# Patient Record
Sex: Male | Born: 1965 | Race: White | Hispanic: No | Marital: Married | Smoking: Former smoker
Health system: Southern US, Community
[De-identification: ages and names within clinical notes are randomized; demographics above are authoritative.]

## PROBLEM LIST (undated history)

## (undated) DIAGNOSIS — F419 Anxiety disorder, unspecified: Secondary | ICD-10-CM

## (undated) DIAGNOSIS — K219 Gastro-esophageal reflux disease without esophagitis: Secondary | ICD-10-CM

## (undated) DIAGNOSIS — I10 Essential (primary) hypertension: Secondary | ICD-10-CM

## (undated) DIAGNOSIS — R51 Headache: Secondary | ICD-10-CM

## (undated) DIAGNOSIS — M1990 Unspecified osteoarthritis, unspecified site: Secondary | ICD-10-CM

## (undated) DIAGNOSIS — R519 Headache, unspecified: Secondary | ICD-10-CM

## (undated) DIAGNOSIS — J45909 Unspecified asthma, uncomplicated: Secondary | ICD-10-CM

## (undated) HISTORY — PX: DG THUMB LEFT HAND: HXRAD1658

## (undated) HISTORY — PX: TONSILLECTOMY: SUR1361

---

## 1998-08-01 ENCOUNTER — Ambulatory Visit (HOSPITAL_BASED_OUTPATIENT_CLINIC_OR_DEPARTMENT_OTHER): Admission: RE | Admit: 1998-08-01 | Discharge: 1998-08-01 | Payer: Self-pay | Admitting: Orthopedic Surgery

## 2000-08-06 ENCOUNTER — Encounter: Payer: Self-pay | Admitting: Family Medicine

## 2000-08-06 ENCOUNTER — Encounter: Admission: RE | Admit: 2000-08-06 | Discharge: 2000-08-06 | Payer: Self-pay | Admitting: Family Medicine

## 2001-11-20 ENCOUNTER — Encounter (INDEPENDENT_AMBULATORY_CARE_PROVIDER_SITE_OTHER): Payer: Self-pay | Admitting: *Deleted

## 2001-11-20 ENCOUNTER — Ambulatory Visit (HOSPITAL_BASED_OUTPATIENT_CLINIC_OR_DEPARTMENT_OTHER): Admission: RE | Admit: 2001-11-20 | Discharge: 2001-11-20 | Payer: Self-pay | Admitting: General Surgery

## 2004-10-24 ENCOUNTER — Encounter: Admission: RE | Admit: 2004-10-24 | Discharge: 2004-10-24 | Payer: Self-pay | Admitting: Orthopedic Surgery

## 2008-09-24 ENCOUNTER — Emergency Department (HOSPITAL_COMMUNITY): Admission: EM | Admit: 2008-09-24 | Discharge: 2008-09-24 | Payer: Self-pay | Admitting: Emergency Medicine

## 2010-03-11 ENCOUNTER — Emergency Department (HOSPITAL_COMMUNITY): Admission: EM | Admit: 2010-03-11 | Discharge: 2010-03-11 | Payer: Self-pay | Admitting: Emergency Medicine

## 2011-01-08 LAB — ETHANOL: Alcohol, Ethyl (B): <5 mg/dL (ref 0–10)

## 2011-01-08 LAB — RAPID URINE DRUG SCREEN, HOSP PERFORMED
Amphetamines: NOT DETECTED
Barbiturates: NOT DETECTED
Benzodiazepines: POSITIVE — AB
Cocaine: NOT DETECTED
Opiates: NOT DETECTED
Tetrahydrocannabinol: POSITIVE — AB

## 2011-01-08 LAB — DIFFERENTIAL
Eosinophils Absolute: 0 10*3/uL (ref 0.0–0.7)
Lymphocytes Relative: 11 % — ABNORMAL LOW (ref 12–46)
Lymphs Abs: 1.2 10*3/uL (ref 0.7–4.0)
Neutro Abs: 9.2 10*3/uL — ABNORMAL HIGH (ref 1.7–7.7)
Neutrophils Relative %: 81 % — ABNORMAL HIGH (ref 43–77)

## 2011-01-08 LAB — CBC
MCV: 91.5 fL (ref 78.0–100.0)
Platelets: 175 10*3/uL (ref 150–400)
RBC: 4.44 MIL/uL (ref 4.22–5.81)
WBC: 11.3 10*3/uL — ABNORMAL HIGH (ref 4.0–10.5)

## 2011-01-08 LAB — BASIC METABOLIC PANEL
BUN: 12 mg/dL (ref 6–23)
Calcium: 8.8 mg/dL (ref 8.4–10.5)
Creatinine, Ser: 0.66 mg/dL (ref 0.4–1.5)
GFR calc Af Amer: >60 mL/min (ref >60)
GFR calc non Af Amer: >60 mL/min (ref >60)

## 2011-01-08 LAB — URINALYSIS, ROUTINE W REFLEX MICROSCOPIC
Bilirubin Urine: NEGATIVE
Glucose, UA: NEGATIVE mg/dL
Hgb urine dipstick: NEGATIVE
Ketones, ur: NEGATIVE mg/dL
Nitrite: NEGATIVE
Protein, ur: NEGATIVE mg/dL
Specific Gravity, Urine: 1.011 (ref 1.005–1.030)
Urobilinogen, UA: 0.2 mg/dL (ref 0.0–1.0)
pH: 6 (ref 5.0–8.0)

## 2011-01-08 LAB — GLUCOSE, CAPILLARY: Glucose-Capillary: 89 mg/dL (ref 70–99)

## 2011-03-09 NOTE — Op Note (Signed)
[place]  Patient:    Jason Mcknight, Jason Mcknight Visit Number: [id number] MRN: [id number]          Service Type: DSU Location: [facility] Attending Physician:  Caleen Essex Dictated by:   Chevis Pretty, M.D. Proc. Date: 11/20/01 Admit Date:  11/20/2001                             Operative Report  PREOPERATIVE DIAGNOSIS:  Pilonidal cyst.  POSTOPERATIVE DIAGNOSIS:  Pilonidal cyst.  OPERATION PERFORMED:  Incision and drainage of pilonidal cyst.  SURGEON:  Chevis Pretty, M.D.  ANESTHESIA:  General endotracheal.  DESCRIPTION OF PROCEDURE:  After informed consent was obtained, the patient was brought to the operating room and left in supine position on the hospital bed.  After adequate induction of general anesthesia, the patient was flipped into a prone position on the operating room table and all pressure points were padded.  Once this was done, the patients gluteal area was prepped with Betadine and draped in the usual sterile manner.  The cystic area in question was readily identifiable and there were a couple of sinus tracts to the skin of the gluteal cleft.  An elliptical incision was made overtop the firm cystic area and the pilonidal cavity was found.  The sinus tracts were probed with a small silver probe and care was taken to make sure that the sinus tracts were opened sharply with the Bovie electrocautery.  The cavity was probed and the depths of it were completely opened also with the Bovie electrocautery until the entire cavity was opened and free.  No more sinus tracts could be identified.  Hemostasis was achieved using the Bovie electrocautery to the side walls of the wound.  At this point the wound was cleaned out and packed with gauze.  Sterile dressings were applied.  The patient tolerated the procedure well.  The patient was then placed back on the hospital bed in the supine position and awakened without difficulty.  The patient was then taken to the  recovery room in stable condition.  At the end of the case all sponge, needle and instrument counts were correct. Dictated by:   Chevis Pretty, M.D. Attending Physician:  Caleen Essex DD:  11/20/01 TD:  11/21/01 Job: [id number]

## 2017-07-31 LAB — HEPATIC FUNCTION PANEL
ALT: 32 (ref 10–40)
AST: 22 (ref 14–40)
Bilirubin, Total: 0.6

## 2017-07-31 LAB — BASIC METABOLIC PANEL
BUN: 16 (ref 4–21)
CREATININE: 0.7 (ref 0.6–1.3)
Glucose: 89
Potassium: 3.9 (ref 3.4–5.3)
Sodium: 138 (ref 137–147)

## 2017-07-31 LAB — CBC AND DIFFERENTIAL
HEMATOCRIT: 45 (ref 41–53)
HEMOGLOBIN: 15.2 (ref 13.5–17.5)
WBC: 7.6

## 2017-07-31 LAB — LIPID PANEL
CHOLESTEROL: 189 (ref 0–200)
HDL: 138 — AB (ref 35–70)
LDL CALC: 103
Triglycerides: 173 — AB (ref 40–160)

## 2017-07-31 LAB — HEMOGLOBIN A1C: HEMOGLOBIN A1C: 5.3

## 2017-12-20 ENCOUNTER — Other Ambulatory Visit: Payer: Self-pay | Admitting: Orthopedic Surgery

## 2017-12-31 ENCOUNTER — Other Ambulatory Visit: Payer: Self-pay | Admitting: Orthopedic Surgery

## 2017-12-31 ENCOUNTER — Ambulatory Visit (HOSPITAL_COMMUNITY)
Admission: RE | Admit: 2017-12-31 | Discharge: 2017-12-31 | Disposition: A | Discharge status: Home / Self Care | Payer: 59 | Source: Ambulatory Visit | Attending: Orthopedic Surgery | Admitting: Orthopedic Surgery

## 2017-12-31 ENCOUNTER — Other Ambulatory Visit: Payer: Self-pay

## 2017-12-31 ENCOUNTER — Encounter (HOSPITAL_COMMUNITY): Payer: Self-pay

## 2017-12-31 ENCOUNTER — Encounter (HOSPITAL_COMMUNITY)
Admission: RE | Admit: 2017-12-31 | Discharge: 2017-12-31 | Disposition: A | Discharge status: Home / Self Care | Payer: 59 | Source: Ambulatory Visit | Attending: Orthopedic Surgery | Admitting: Orthopedic Surgery

## 2017-12-31 DIAGNOSIS — Z01818 Encounter for other preprocedural examination: Secondary | ICD-10-CM

## 2017-12-31 DIAGNOSIS — Z01810 Encounter for preprocedural cardiovascular examination: Secondary | ICD-10-CM | POA: Diagnosis present

## 2017-12-31 DIAGNOSIS — Z01812 Encounter for preprocedural laboratory examination: Secondary | ICD-10-CM | POA: Diagnosis present

## 2017-12-31 DIAGNOSIS — M1712 Unilateral primary osteoarthritis, left knee: Secondary | ICD-10-CM | POA: Diagnosis not present

## 2017-12-31 HISTORY — DX: Unspecified asthma, uncomplicated: J45.909

## 2017-12-31 HISTORY — DX: Headache: R51

## 2017-12-31 HISTORY — DX: Gastro-esophageal reflux disease without esophagitis: K21.9

## 2017-12-31 HISTORY — DX: Unspecified osteoarthritis, unspecified site: M19.90

## 2017-12-31 HISTORY — DX: Anxiety disorder, unspecified: F41.9

## 2017-12-31 HISTORY — DX: Essential (primary) hypertension: I10

## 2017-12-31 HISTORY — DX: Headache, unspecified: R51.9

## 2017-12-31 LAB — PROTIME-INR
INR: 1.03
Prothrombin Time: 13.4 seconds (ref 11.4–15.2)

## 2017-12-31 LAB — SURGICAL PCR SCREEN
MRSA, PCR: NEGATIVE
Staphylococcus aureus: NEGATIVE

## 2017-12-31 LAB — CBC WITH DIFFERENTIAL/PLATELET
Basophils Absolute: 0 10*3/uL (ref 0.0–0.1)
Basophils Relative: 0 %
EOS ABS: 0.1 10*3/uL (ref 0.0–0.7)
Eosinophils Relative: 1 %
HCT: 42.3 % (ref 39.0–52.0)
Hemoglobin: 14.8 g/dL (ref 13.0–17.0)
LYMPHS ABS: 1.9 10*3/uL (ref 0.7–4.0)
LYMPHS PCT: 26 %
MCH: 32.2 pg (ref 26.0–34.0)
MCHC: 35 g/dL (ref 30.0–36.0)
MCV: 92 fL (ref 78.0–100.0)
Monocytes Absolute: 0.8 10*3/uL (ref 0.1–1.0)
Monocytes Relative: 10 %
Neutro Abs: 4.7 10*3/uL (ref 1.7–7.7)
Neutrophils Relative %: 63 %
Platelets: 214 10*3/uL (ref 150–400)
RBC: 4.6 MIL/uL (ref 4.22–5.81)
RDW: 12.6 % (ref 11.5–15.5)
WBC: 7.4 10*3/uL (ref 4.0–10.5)

## 2017-12-31 LAB — COMPREHENSIVE METABOLIC PANEL
ALK PHOS: 65 U/L (ref 38–126)
ALT: 35 U/L (ref 17–63)
AST: 29 U/L (ref 15–41)
Albumin: 4.4 g/dL (ref 3.5–5.0)
Anion gap: 8 (ref 5–15)
BUN: 14 mg/dL (ref 6–20)
CALCIUM: 9.4 mg/dL (ref 8.9–10.3)
CO2: 28 mmol/L (ref 22–32)
CREATININE: 0.7 mg/dL (ref 0.61–1.24)
Chloride: 103 mmol/L (ref 101–111)
GFR calc non Af Amer: >60 mL/min (ref >60)
Glucose, Bld: 97 mg/dL (ref 65–99)
Potassium: 4.2 mmol/L (ref 3.5–5.1)
SODIUM: 139 mmol/L (ref 135–145)
Total Bilirubin: 0.4 mg/dL (ref 0.3–1.2)
Total Protein: 7.4 g/dL (ref 6.5–8.1)

## 2017-12-31 LAB — ABO/RH: ABO/RH(D): B POS

## 2017-12-31 LAB — URINALYSIS, ROUTINE W REFLEX MICROSCOPIC
BILIRUBIN URINE: NEGATIVE
GLUCOSE, UA: NEGATIVE mg/dL
HGB URINE DIPSTICK: NEGATIVE
KETONES UR: NEGATIVE mg/dL
LEUKOCYTES UA: NEGATIVE
NITRITE: NEGATIVE
Protein, ur: NEGATIVE mg/dL
Specific Gravity, Urine: 1.026 (ref 1.005–1.030)
pH: 5 (ref 5.0–8.0)

## 2017-12-31 LAB — APTT: aPTT: 29 seconds (ref 24–36)

## 2017-12-31 NOTE — Patient Instructions (Signed)
Jason Mcknight  12/31/2017   Your procedure is scheduled on: Friday 01/03/2018   Report to [facility] Main  Entrance              Report to admitting at   1245 PM    Call this number if you have problems the morning of surgery [phone]    Remember: Do not eat food :After Midnight.  May have clear liquids from midnight up until 0915 am then nothing until after surgery!     CLEAR LIQUID DIET   Foods Allowed                                                                     Foods Excluded  Coffee and tea, regular and decaf                             liquids that you cannot  Plain Jell-O in any flavor                                             see through such as: Fruit ices (not with fruit pulp)                                     milk, soups, orange juice  Iced Popsicles                                    All solid food Carbonated beverages, regular and diet                                    Cranberry, grape and apple juices Sports drinks like Gatorade Lightly seasoned clear broth or consume(fat free) Sugar, honey syrup  Sample Menu Breakfast                                Lunch                                     Supper Cranberry juice                    Beef broth                            Chicken broth Jell-O                                     Grape juice  Apple juice Coffee or tea                        Jell-O                                      Popsicle                                                Coffee or tea                        Coffee or tea  _____________________________________________________________________     Take these medicines the morning of surgery with A SIP OF WATER: Atenolol (Tenormin)                                You may not have any metal on your body including hair pins and              piercings  Do not wear jewelry, make-up, lotions, powders or perfumes, deodorant             Do  not wear nail polish.  Do not shave  48 hours prior to surgery.              Men may shave face and neck.   Do not bring valuables to the hospital. [place].  Contacts, dentures or bridgework may not be worn into surgery.  Leave suitcase in the car. After surgery it may be brought to your room.                  Please read over the following fact sheets you were given: _____________________________________________________________________             [facility] - Preparing for Surgery Before surgery, you can play an important role.  Because skin is not sterile, your skin needs to be as free of germs as possible.  You can reduce the number of germs on your skin by washing with CHG (chlorahexidine gluconate) soap before surgery.  CHG is an antiseptic cleaner which kills germs and bonds with the skin to continue killing germs even after washing. Please DO NOT use if you have an allergy to CHG or antibacterial soaps.  If your skin becomes reddened/irritated stop using the CHG and inform your nurse when you arrive at Short Stay. Do not shave (including legs and underarms) for at least 48 hours prior to the first CHG shower.  You may shave your face/neck. Please follow these instructions carefully:  1.  Shower with CHG Soap the night before surgery and the  morning of Surgery.  2.  If you choose to wash your hair, wash your hair first as usual with your  normal  shampoo.  3.  After you shampoo, rinse your hair and body thoroughly to remove the  shampoo.                           4.  Use CHG as you would any  other liquid soap.  You can apply chg directly  to the skin and wash                       Gently with a scrungie or clean washcloth.  5.  Apply the CHG Soap to your body ONLY FROM THE NECK DOWN.   Do not use on face/ open                           Wound or open sores. Avoid contact with eyes, ears mouth and genitals (private parts).                        Wash face,  Genitals (private parts) with your normal soap.             6.  Wash thoroughly, paying special attention to the area where your surgery  will be performed.  7.  Thoroughly rinse your body with warm water from the neck down.  8.  DO NOT shower/wash with your normal soap after using and rinsing off  the CHG Soap.                9.  Pat yourself dry with a clean towel.            10.  Wear clean pajamas.            11.  Place clean sheets on your bed the night of your first shower and do not  sleep with pets. Day of Surgery : Do not apply any lotions/deodorants the morning of surgery.  Please wear clean clothes to the hospital/surgery center.  FAILURE TO FOLLOW THESE INSTRUCTIONS MAY RESULT IN THE CANCELLATION OF YOUR SURGERY PATIENT SIGNATURE_________________________________  NURSE SIGNATURE__________________________________  ________________________________________________________________________   Adam Phenix  An incentive spirometer is a tool that can help keep your lungs clear and active. This tool measures how well you are filling your lungs with each breath. Taking long deep breaths may help reverse or decrease the chance of developing breathing (pulmonary) problems (especially infection) following:  A long period of time when you are unable to move or be active. BEFORE THE PROCEDURE   If the spirometer includes an indicator to show your best effort, your nurse or respiratory therapist will set it to a desired goal.  If possible, sit up straight or lean slightly forward. Try not to slouch.  Hold the incentive spirometer in an upright position. INSTRUCTIONS FOR USE  1. Sit on the edge of your bed if possible, or sit up as far as you can in bed or on a chair. 2. Hold the incentive spirometer in an upright position. 3. Breathe out normally. 4. Place the mouthpiece in your mouth and seal your lips tightly around it. 5. Breathe in slowly and as  deeply as possible, raising the piston or the ball toward the top of the column. 6. Hold your breath for 3-5 seconds or for as long as possible. Allow the piston or ball to fall to the bottom of the column. 7. Remove the mouthpiece from your mouth and breathe out normally. 8. Rest for a few seconds and repeat Steps 1 through 7 at least 10 times every 1-2 hours when you are awake. Take your time and take a few normal breaths between deep breaths. 9. The spirometer may include an indicator to show your best effort. Use the indicator as  a goal to work toward during each repetition. 10. After each set of 10 deep breaths, practice coughing to be sure your lungs are clear. If you have an incision (the cut made at the time of surgery), support your incision when coughing by placing a pillow or rolled up towels firmly against it. Once you are able to get out of bed, walk around indoors and cough well. You may stop using the incentive spirometer when instructed by your caregiver.  RISKS AND COMPLICATIONS  Take your time so you do not get dizzy or light-headed.  If you are in pain, you may need to take or ask for pain medication before doing incentive spirometry. It is harder to take a deep breath if you are having pain. AFTER USE  Rest and breathe slowly and easily.  It can be helpful to keep track of a log of your progress. Your caregiver can provide you with a simple table to help with this. If you are using the spirometer at home, follow these instructions: [place] IF:   You are having difficultly using the spirometer.  You have trouble using the spirometer as often as instructed.  Your pain medication is not giving enough relief while using the spirometer.  You develop fever of 100.5 F (38.1 C) or higher. SEEK IMMEDIATE MEDICAL CARE IF:   You cough up bloody sputum that had not been present before.  You develop fever of 102 F (38.9 C) or greater.  You develop worsening pain  at or near the incision site. MAKE SURE YOU:   Understand these instructions.  Will watch your condition.  Will get help right away if you are not doing well or get worse. Document Released: 02/18/2007 Document Revised: 12/31/2011 Document Reviewed: 04/21/2007 ExitCare Patient Information 2014 Exit[employer], [place].   ________________________________________________________________________  WHAT IS A BLOOD TRANSFUSION? Blood Transfusion Information  A transfusion is the replacement of blood or some of its parts. Blood is made up of multiple cells which provide different functions.  Red blood cells carry oxygen and are used for blood loss replacement.  White blood cells fight against infection.  Platelets control bleeding.  Plasma helps clot blood.  Other blood products are available for specialized needs, such as hemophilia or other clotting disorders. BEFORE THE TRANSFUSION  Who gives blood for transfusions?   Healthy volunteers who are fully evaluated to make sure their blood is safe. This is blood bank blood. Transfusion therapy is the safest it has ever been in the practice of medicine. Before blood is taken from a donor, a complete history is taken to make sure that person has no history of diseases nor engages in risky social behavior (examples are intravenous drug use or sexual activity with multiple partners). The donor's travel history is screened to minimize risk of transmitting infections, such as malaria. The donated blood is tested for signs of infectious diseases, such as HIV and hepatitis. The blood is then tested to be sure it is compatible with you in order to minimize the chance of a transfusion reaction. If you or a relative donates blood, this is often done in anticipation of surgery and is not appropriate for emergency situations. It takes many days to process the donated blood. RISKS AND COMPLICATIONS Although transfusion therapy is very safe and saves many lives, the  main dangers of transfusion include:   Getting an infectious disease.  Developing a transfusion reaction. This is an allergic reaction to something in the blood you were given.  Every precaution is taken to prevent this. The decision to have a blood transfusion has been considered carefully by your caregiver before blood is given. Blood is not given unless the benefits outweigh the risks. AFTER THE TRANSFUSION  Right after receiving a blood transfusion, you will usually feel much better and more energetic. This is especially true if your red blood cells have gotten low (anemic). The transfusion raises the level of the red blood cells which carry oxygen, and this usually causes an energy increase.  The nurse administering the transfusion will monitor you carefully for complications. HOME CARE INSTRUCTIONS  No special instructions are needed after a transfusion. You may find your energy is better. Speak with your caregiver about any limitations on activity for underlying diseases you may have. SEEK MEDICAL CARE IF:   Your condition is not improving after your transfusion.  You develop redness or irritation at the intravenous (IV) site. SEEK IMMEDIATE MEDICAL CARE IF:  Any of the following symptoms occur over the next 12 hours:  Shaking chills.  You have a temperature by mouth above 102 F (38.9 C), not controlled by medicine.  Chest, back, or muscle pain.  People around you feel you are not acting correctly or are confused.  Shortness of breath or difficulty breathing.  Dizziness and fainting.  You get a rash or develop hives.  You have a decrease in urine output.  Your urine turns a dark color or changes to pink, red, or brown. Any of the following symptoms occur over the next 10 days:  You have a temperature by mouth above 102 F (38.9 C), not controlled by medicine.  Shortness of breath.  Weakness after normal activity.  The white part of the eye turns yellow  (jaundice).  You have a decrease in the amount of urine or are urinating less often.  Your urine turns a dark color or changes to pink, red, or brown. Document Released: 10/05/2000 Document Revised: 12/31/2011 Document Reviewed: 05/24/2008 [facility] Patient Information 2014 [place], [place].  _______________________________________________________________________

## 2018-01-02 NOTE — Anesthesia Preprocedure Evaluation (Addendum)
Anesthesia Evaluation  Patient identified by MRN, date of birth, ID band Patient awake    Reviewed: Allergy & Precautions, NPO status , Patient's Chart, lab work & pertinent test results  Airway Mallampati: II  TM Distance: >3 FB Neck ROM: Full    Dental no notable dental hx.    Pulmonary asthma , former smoker,    Pulmonary exam normal breath sounds clear to auscultation       Cardiovascular hypertension, Normal cardiovascular exam Rhythm:Regular Rate:Normal     Neuro/Psych negative neurological ROS     GI/Hepatic Neg liver ROS,   Endo/Other    Renal/GU      Musculoskeletal   Abdominal   Peds  Hematology   Anesthesia Other Findings   Reproductive/Obstetrics                            Lab Results  Component Value Date   WBC 7.4 12/31/2017   HGB 14.8 12/31/2017   HCT 42.3 12/31/2017   MCV 92.0 12/31/2017   PLT 214 12/31/2017   Lab Results  Component Value Date   CREATININE 0.70 12/31/2017   BUN 14 12/31/2017   NA 139 12/31/2017   K 4.2 12/31/2017   CL 103 12/31/2017   CO2 28 12/31/2017     Anesthesia Physical Anesthesia Plan  ASA: II  Anesthesia Plan: Spinal   Post-op Pain Management:    Induction:   PONV Risk Score and Plan:   Airway Management Planned: Mask, Natural Airway and Nasal Cannula  Additional Equipment:   Intra-op Plan:   Post-operative Plan:   Informed Consent: I have reviewed the patients History and Physical, chart, labs and discussed the procedure including the risks, benefits and alternatives for the proposed anesthesia with the patient or authorized representative who has indicated his/her understanding and acceptance.     Plan Discussed with: CRNA  Anesthesia Plan Comments:         Anesthesia Quick Evaluation

## 2018-01-03 ENCOUNTER — Inpatient Hospital Stay (HOSPITAL_COMMUNITY): Payer: 59 | Admitting: Anesthesiology

## 2018-01-03 ENCOUNTER — Encounter (HOSPITAL_COMMUNITY): Payer: Self-pay

## 2018-01-03 ENCOUNTER — Other Ambulatory Visit: Payer: Self-pay

## 2018-01-03 ENCOUNTER — Inpatient Hospital Stay (HOSPITAL_COMMUNITY)
Admission: RE | Admit: 2018-01-03 | Discharge: 2018-01-04 | DRG: 470 | Disposition: A | Discharge status: Home / Self Care | Payer: 59 | Source: Ambulatory Visit | Attending: Orthopedic Surgery | Admitting: Orthopedic Surgery

## 2018-01-03 ENCOUNTER — Encounter (HOSPITAL_COMMUNITY)
Admission: RE | Disposition: A | Discharge status: Home / Self Care | Payer: Self-pay | Source: Ambulatory Visit | Attending: Orthopedic Surgery

## 2018-01-03 DIAGNOSIS — M1712 Unilateral primary osteoarthritis, left knee: Secondary | ICD-10-CM | POA: Diagnosis present

## 2018-01-03 DIAGNOSIS — I10 Essential (primary) hypertension: Secondary | ICD-10-CM | POA: Diagnosis present

## 2018-01-03 DIAGNOSIS — Z87891 Personal history of nicotine dependence: Secondary | ICD-10-CM | POA: Diagnosis not present

## 2018-01-03 DIAGNOSIS — F419 Anxiety disorder, unspecified: Secondary | ICD-10-CM | POA: Diagnosis present

## 2018-01-03 DIAGNOSIS — M223X2 Other derangements of patella, left knee: Secondary | ICD-10-CM | POA: Diagnosis present

## 2018-01-03 DIAGNOSIS — D62 Acute posthemorrhagic anemia: Secondary | ICD-10-CM | POA: Diagnosis not present

## 2018-01-03 DIAGNOSIS — M25562 Pain in left knee: Secondary | ICD-10-CM | POA: Diagnosis present

## 2018-01-03 HISTORY — PX: TOTAL KNEE ARTHROPLASTY: SHX125

## 2018-01-03 LAB — TYPE AND SCREEN
ABO/RH(D): B POS
Antibody Screen: NEGATIVE

## 2018-01-03 SURGERY — ARTHROPLASTY, KNEE, TOTAL
Anesthesia: Spinal | Site: Knee | Laterality: Left

## 2018-01-03 MED ORDER — ALUM & MAG HYDROXIDE-SIMETH 200-200-20 MG/5ML PO SUSP: 30.0000 mL | ORAL | Status: DC | PRN

## 2018-01-03 MED ORDER — SODIUM CHLORIDE 0.9 % IJ SOLN
INTRAMUSCULAR | Status: DC | PRN
  Administered 2018-01-03: 30 mL

## 2018-01-03 MED ORDER — POLYETHYLENE GLYCOL 3350 17 G PO PACK: 17.0000 g | PACK | Freq: Every day | ORAL | Status: DC | PRN

## 2018-01-03 MED ORDER — CEFAZOLIN SODIUM-DEXTROSE 2-4 GM/100ML-% IV SOLN
2.0000 g | INTRAVENOUS | Status: AC
  Administered 2018-01-03: 2 g via INTRAVENOUS
  Filled 2018-01-03: qty 100

## 2018-01-03 MED ORDER — CHLORHEXIDINE GLUCONATE 4 % EX LIQD: 60.0000 mL | Freq: Once | CUTANEOUS | Status: DC

## 2018-01-03 MED ORDER — MIDAZOLAM HCL 5 MG/5ML IJ SOLN
INTRAMUSCULAR | Status: DC | PRN
  Administered 2018-01-03: 2 mg via INTRAVENOUS

## 2018-01-03 MED ORDER — GABAPENTIN 300 MG PO CAPS
300.0000 mg | ORAL_CAPSULE | Freq: Two times a day (BID) | ORAL | Status: DC
  Administered 2018-01-03 – 2018-01-04 (×2): 300 mg via ORAL
  Filled 2018-01-03 (×2): qty 1

## 2018-01-03 MED ORDER — TRANEXAMIC ACID 1000 MG/10ML IV SOLN
2000.0000 mg | Freq: Once | INTRAVENOUS | Status: DC
  Filled 2018-01-03: qty 20

## 2018-01-03 MED ORDER — 0.9 % SODIUM CHLORIDE (POUR BTL) OPTIME
TOPICAL | Status: DC | PRN
  Administered 2018-01-03: 1000 mL

## 2018-01-03 MED ORDER — TRANEXAMIC ACID 1000 MG/10ML IV SOLN
1000.0000 mg | INTRAVENOUS | Status: AC
  Administered 2018-01-03: 1000 mg via INTRAVENOUS
  Filled 2018-01-03: qty 1100

## 2018-01-03 MED ORDER — ONDANSETRON HCL 4 MG/2ML IJ SOLN
4.0000 mg | Freq: Four times a day (QID) | INTRAMUSCULAR | Status: DC | PRN
  Filled 2018-01-03: qty 2

## 2018-01-03 MED ORDER — PROPOFOL 500 MG/50ML IV EMUL
INTRAVENOUS | Status: DC | PRN
  Administered 2018-01-03: 75 ug/kg/min via INTRAVENOUS

## 2018-01-03 MED ORDER — CEFAZOLIN SODIUM-DEXTROSE 2-4 GM/100ML-% IV SOLN
2.0000 g | Freq: Four times a day (QID) | INTRAVENOUS | Status: AC
  Administered 2018-01-03 – 2018-01-04 (×2): 2 g via INTRAVENOUS
  Filled 2018-01-03 (×2): qty 100

## 2018-01-03 MED ORDER — SODIUM CHLORIDE 0.9 % IJ SOLN
INTRAMUSCULAR | Status: AC
  Filled 2018-01-03: qty 50

## 2018-01-03 MED ORDER — FENTANYL CITRATE (PF) 100 MCG/2ML IJ SOLN: 50.0000 ug | INTRAMUSCULAR | Status: DC

## 2018-01-03 MED ORDER — PROPOFOL 10 MG/ML IV BOLUS
INTRAVENOUS | Status: AC
  Filled 2018-01-03: qty 20

## 2018-01-03 MED ORDER — PRAVASTATIN SODIUM 20 MG PO TABS
10.0000 mg | ORAL_TABLET | Freq: Every day | ORAL | Status: DC
  Filled 2018-01-03: qty 1

## 2018-01-03 MED ORDER — BUPIVACAINE LIPOSOME 1.3 % IJ SUSP
20.0000 mL | Freq: Once | INTRAMUSCULAR | Status: DC
  Filled 2018-01-03: qty 20

## 2018-01-03 MED ORDER — FENTANYL CITRATE (PF) 100 MCG/2ML IJ SOLN
INTRAMUSCULAR | Status: AC
  Administered 2018-01-03: 100 ug
  Filled 2018-01-03: qty 2

## 2018-01-03 MED ORDER — OXYCODONE HCL 5 MG PO TABS
5.0000 mg | ORAL_TABLET | ORAL | Status: DC | PRN
  Administered 2018-01-03 – 2018-01-04 (×3): 10 mg via ORAL
  Filled 2018-01-03 (×3): qty 2

## 2018-01-03 MED ORDER — BUPIVACAINE-EPINEPHRINE 0.5% -1:200000 IJ SOLN
INTRAMUSCULAR | Status: DC | PRN
  Administered 2018-01-03: 30 mL

## 2018-01-03 MED ORDER — TIZANIDINE HCL 2 MG PO TABS: 2.0000 mg | ORAL_TABLET | Freq: Three times a day (TID) | ORAL | 0 refills | Status: DC | PRN

## 2018-01-03 MED ORDER — FENTANYL CITRATE (PF) 100 MCG/2ML IJ SOLN
INTRAMUSCULAR | Status: AC
  Filled 2018-01-03: qty 2

## 2018-01-03 MED ORDER — DOCUSATE SODIUM 100 MG PO CAPS
100.0000 mg | ORAL_CAPSULE | Freq: Two times a day (BID) | ORAL | Status: DC
  Administered 2018-01-03 – 2018-01-04 (×2): 100 mg via ORAL
  Filled 2018-01-03 (×2): qty 1

## 2018-01-03 MED ORDER — BUPIVACAINE LIPOSOME 1.3 % IJ SUSP
INTRAMUSCULAR | Status: DC | PRN
  Administered 2018-01-03: 20 mL

## 2018-01-03 MED ORDER — HYDROMORPHONE HCL 1 MG/ML IJ SOLN
0.5000 mg | INTRAMUSCULAR | Status: DC | PRN
  Administered 2018-01-03 – 2018-01-04 (×3): 1 mg via INTRAVENOUS
  Filled 2018-01-03 (×4): qty 1

## 2018-01-03 MED ORDER — METHOCARBAMOL 500 MG PO TABS
500.0000 mg | ORAL_TABLET | Freq: Four times a day (QID) | ORAL | Status: DC | PRN
  Administered 2018-01-04: 08:00:00 500 mg via ORAL
  Filled 2018-01-03 (×2): qty 1

## 2018-01-03 MED ORDER — ATENOLOL 50 MG PO TABS
100.0000 mg | ORAL_TABLET | Freq: Every day | ORAL | Status: DC
Start: 2018-01-04 — End: 2018-01-04
  Administered 2018-01-04: 100 mg via ORAL
  Filled 2018-01-03: qty 2

## 2018-01-03 MED ORDER — PROPOFOL 10 MG/ML IV BOLUS
INTRAVENOUS | Status: AC
  Filled 2018-01-03: qty 60

## 2018-01-03 MED ORDER — ROPIVACAINE HCL 7.5 MG/ML IJ SOLN
INTRAMUSCULAR | Status: DC | PRN
  Administered 2018-01-03: 20 mL via PERINEURAL

## 2018-01-03 MED ORDER — OXYCODONE-ACETAMINOPHEN 5-325 MG PO TABS: 1.0000 | ORAL_TABLET | Freq: Four times a day (QID) | ORAL | 0 refills | Status: DC | PRN

## 2018-01-03 MED ORDER — ONDANSETRON HCL 4 MG/2ML IJ SOLN
INTRAMUSCULAR | Status: DC | PRN
  Administered 2018-01-03: 4 mg via INTRAVENOUS

## 2018-01-03 MED ORDER — ASPIRIN EC 325 MG PO TBEC: 325.0000 mg | DELAYED_RELEASE_TABLET | Freq: Two times a day (BID) | ORAL | 0 refills | Status: DC

## 2018-01-03 MED ORDER — DOCUSATE SODIUM 100 MG PO CAPS: 100.0000 mg | ORAL_CAPSULE | Freq: Two times a day (BID) | ORAL | 0 refills | Status: DC

## 2018-01-03 MED ORDER — DIPHENHYDRAMINE HCL 12.5 MG/5ML PO ELIX
12.5000 mg | ORAL_SOLUTION | ORAL | Status: DC | PRN
  Administered 2018-01-03: 25 mg via ORAL
  Filled 2018-01-03: qty 10

## 2018-01-03 MED ORDER — MIDAZOLAM HCL 2 MG/2ML IJ SOLN
1.0000 mg | INTRAMUSCULAR | Status: AC
  Administered 2018-01-03: 2 mg via INTRAVENOUS
  Filled 2018-01-03: qty 2

## 2018-01-03 MED ORDER — STERILE WATER FOR IRRIGATION IR SOLN
Status: DC | PRN
  Administered 2018-01-03: 2000 mL

## 2018-01-03 MED ORDER — SODIUM CHLORIDE 0.9 % IR SOLN
Status: DC | PRN
  Administered 2018-01-03: 1000 mL

## 2018-01-03 MED ORDER — BUPIVACAINE-EPINEPHRINE (PF) 0.5% -1:200000 IJ SOLN
INTRAMUSCULAR | Status: AC
  Filled 2018-01-03: qty 30

## 2018-01-03 MED ORDER — FENTANYL CITRATE (PF) 100 MCG/2ML IJ SOLN
INTRAMUSCULAR | Status: DC | PRN
  Administered 2018-01-03: 100 ug via INTRAVENOUS

## 2018-01-03 MED ORDER — ASPIRIN EC 325 MG PO TBEC
325.0000 mg | DELAYED_RELEASE_TABLET | Freq: Two times a day (BID) | ORAL | Status: DC
  Administered 2018-01-03 – 2018-01-04 (×2): 325 mg via ORAL
  Filled 2018-01-03 (×2): qty 1

## 2018-01-03 MED ORDER — LAMOTRIGINE 100 MG PO TABS
200.0000 mg | ORAL_TABLET | Freq: Every day | ORAL | Status: DC
  Administered 2018-01-03: 22:00:00 200 mg via ORAL
  Filled 2018-01-03: qty 2

## 2018-01-03 MED ORDER — MIDAZOLAM HCL 2 MG/2ML IJ SOLN
INTRAMUSCULAR | Status: AC
  Filled 2018-01-03: qty 2

## 2018-01-03 MED ORDER — ACETAMINOPHEN 325 MG PO TABS
325.0000 mg | ORAL_TABLET | Freq: Four times a day (QID) | ORAL | Status: DC | PRN
  Administered 2018-01-03: 325 mg via ORAL
  Filled 2018-01-03: qty 2

## 2018-01-03 MED ORDER — TRAMADOL HCL 50 MG PO TABS
50.0000 mg | ORAL_TABLET | Freq: Four times a day (QID) | ORAL | Status: DC
  Administered 2018-01-03 – 2018-01-04 (×4): 50 mg via ORAL
  Filled 2018-01-03 (×4): qty 1

## 2018-01-03 MED ORDER — TRANEXAMIC ACID 1000 MG/10ML IV SOLN
1000.0000 mg | Freq: Once | INTRAVENOUS | Status: AC
  Administered 2018-01-03: 22:00:00 1000 mg via INTRAVENOUS
  Filled 2018-01-03: qty 1100

## 2018-01-03 MED ORDER — SODIUM CHLORIDE 0.9 % IV SOLN
INTRAVENOUS | Status: DC
  Administered 2018-01-03: 22:00:00 via INTRAVENOUS

## 2018-01-03 MED ORDER — MAGNESIUM CITRATE PO SOLN: 1.0000 | Freq: Once | ORAL | Status: DC | PRN

## 2018-01-03 MED ORDER — ONDANSETRON HCL 4 MG PO TABS: 4.0000 mg | ORAL_TABLET | Freq: Four times a day (QID) | ORAL | Status: DC | PRN

## 2018-01-03 MED ORDER — ALPRAZOLAM 1 MG PO TABS
1.0000 mg | ORAL_TABLET | Freq: Two times a day (BID) | ORAL | Status: DC | PRN
  Administered 2018-01-03: 22:00:00 1 mg via ORAL
  Filled 2018-01-03: qty 1

## 2018-01-03 MED ORDER — BUPIVACAINE IN DEXTROSE 0.75-8.25 % IT SOLN
INTRATHECAL | Status: DC | PRN
  Administered 2018-01-03: 2 mL via INTRATHECAL

## 2018-01-03 MED ORDER — LACTATED RINGERS IV SOLN
INTRAVENOUS | Status: DC
  Administered 2018-01-03 (×2): via INTRAVENOUS

## 2018-01-03 MED ORDER — DEXTROSE 5 % IV SOLN
500.0000 mg | Freq: Four times a day (QID) | INTRAVENOUS | Status: DC | PRN
  Administered 2018-01-03: 500 mg via INTRAVENOUS
  Filled 2018-01-03: qty 550

## 2018-01-03 MED ORDER — DEXAMETHASONE SODIUM PHOSPHATE 10 MG/ML IJ SOLN
10.0000 mg | Freq: Two times a day (BID) | INTRAMUSCULAR | Status: DC
  Administered 2018-01-03 – 2018-01-04 (×2): 10 mg via INTRAVENOUS
  Filled 2018-01-03 (×2): qty 1

## 2018-01-03 MED ORDER — BISACODYL 5 MG PO TBEC: 5.0000 mg | DELAYED_RELEASE_TABLET | Freq: Every day | ORAL | Status: DC | PRN

## 2018-01-03 SURGICAL SUPPLY — 50 items
BAG ZIPLOCK 12X15 (MISCELLANEOUS) ×3 IMPLANT
BANDAGE ACE 6X5 VEL STRL LF (GAUZE/BANDAGES/DRESSINGS) ×3 IMPLANT
BENZOIN TINCTURE PRP APPL 2/3 (GAUZE/BANDAGES/DRESSINGS) ×3 IMPLANT
BLADE SAG 18X100X1.27 (BLADE) ×3 IMPLANT
BLADE SAW SGTL 13.0X1.19X90.0M (BLADE) ×3 IMPLANT
BOOTIES KNEE HIGH SLOAN (MISCELLANEOUS) ×3 IMPLANT
BOWL SMART MIX CTS (DISPOSABLE) ×3 IMPLANT
CAPT KNEE TOTAL 3 ATTUNE ×3 IMPLANT
CEMENT HV SMART SET (Cement) ×6 IMPLANT
CLOSURE WOUND 1/2 X4 (GAUZE/BANDAGES/DRESSINGS) ×2
CUFF TOURN SGL QUICK 34 (TOURNIQUET CUFF) ×2
CUFF TRNQT CYL 34X4X40X1 (TOURNIQUET CUFF) ×1 IMPLANT
DRAPE U-SHAPE 47X51 STRL (DRAPES) ×3 IMPLANT
DRESSING AQUACEL AG SP 3.5X10 (GAUZE/BANDAGES/DRESSINGS) ×1 IMPLANT
DRSG AQUACEL AG SP 3.5X10 (GAUZE/BANDAGES/DRESSINGS) ×3
DURAPREP 26ML APPLICATOR (WOUND CARE) ×3 IMPLANT
ELECT REM PT RETURN 15FT ADLT (MISCELLANEOUS) ×3 IMPLANT
GAUZE SPONGE 4X4 12PLY STRL (GAUZE/BANDAGES/DRESSINGS) ×3 IMPLANT
GLOVE BIOGEL PI IND STRL 7.5 (GLOVE) ×5 IMPLANT
GLOVE BIOGEL PI IND STRL 8 (GLOVE) ×2 IMPLANT
GLOVE BIOGEL PI IND STRL 8.5 (GLOVE) ×1 IMPLANT
GLOVE BIOGEL PI INDICATOR 7.5 (GLOVE) ×10
GLOVE BIOGEL PI INDICATOR 8 (GLOVE) ×4
GLOVE BIOGEL PI INDICATOR 8.5 (GLOVE) ×2
GLOVE ECLIPSE 7.5 STRL STRAW (GLOVE) ×6 IMPLANT
GLOVE SURG SS PI 7.5 STRL IVOR (GLOVE) ×3 IMPLANT
GOWN SPEC L3 XXLG W/TWL (GOWN DISPOSABLE) ×3 IMPLANT
GOWN STRL REUS W/TWL XL LVL3 (GOWN DISPOSABLE) ×9 IMPLANT
HANDPIECE INTERPULSE COAX TIP (DISPOSABLE) ×2
HOOD PEEL AWAY FLYTE STAYCOOL (MISCELLANEOUS) ×9 IMPLANT
IMMOBILIZER KNEE 20 (SOFTGOODS) ×3
IMMOBILIZER KNEE 20 THIGH 36 (SOFTGOODS) ×1 IMPLANT
MANIFOLD NEPTUNE II (INSTRUMENTS) ×3 IMPLANT
NEEDLE HYPO 22GX1.5 SAFETY (NEEDLE) ×3 IMPLANT
PACK ICE MAXI GEL EZY WRAP (MISCELLANEOUS) ×3 IMPLANT
PACK TOTAL KNEE CUSTOM (KITS) ×3 IMPLANT
PADDING CAST COTTON 6X4 STRL (CAST SUPPLIES) ×3 IMPLANT
POSITIONER SURGICAL ARM (MISCELLANEOUS) ×3 IMPLANT
SET HNDPC FAN SPRY TIP SCT (DISPOSABLE) ×1 IMPLANT
STRIP CLOSURE SKIN 1/2X4 (GAUZE/BANDAGES/DRESSINGS) ×4 IMPLANT
SUT MNCRL AB 3-0 PS2 18 (SUTURE) ×3 IMPLANT
SUT VIC AB 0 CT1 36 (SUTURE) ×3 IMPLANT
SUT VIC AB 1 CT1 36 (SUTURE) ×6 IMPLANT
SUT VIC AB 2-0 CT1 27 (SUTURE) ×4
SUT VIC AB 2-0 CT1 TAPERPNT 27 (SUTURE) ×2 IMPLANT
SWABSTK COMLB BENZOIN TINCTURE (MISCELLANEOUS) ×3 IMPLANT
TOWEL OR NON WOVEN STRL DISP B (DISPOSABLE) ×3 IMPLANT
TRAY FOLEY W/METER SILVER 16FR (SET/KITS/TRAYS/PACK) ×3 IMPLANT
WRAP KNEE MAXI GEL POST OP (GAUZE/BANDAGES/DRESSINGS) ×3 IMPLANT
YANKAUER SUCT BULB TIP 10FT TU (MISCELLANEOUS) ×3 IMPLANT

## 2018-01-03 NOTE — Discharge Instructions (Signed)

## 2018-01-03 NOTE — Progress Notes (Signed)
AssistedDr. Houser with left, ultrasound guided, adductor canal block. Side rails up, monitors on throughout procedure. See vital signs in flow sheet. Tolerated Procedure well.  

## 2018-01-03 NOTE — Plan of Care (Signed)
  Progressing Education: Knowledge of General Education information will improve 01/03/2018 1905 - Progressing by Milderd Meager, RN Health Behavior/Discharge Planning: Ability to manage health-related needs will improve 01/03/2018 1905 - Progressing by Milderd Meager, RN Clinical Measurements: Ability to maintain clinical measurements within normal limits will improve 01/03/2018 1905 - Progressing by Milderd Meager, RN Will remain free from infection 01/03/2018 1905 - Progressing by Milderd Meager, RN Diagnostic test results will improve 01/03/2018 1905 - Progressing by Milderd Meager, RN Respiratory complications will improve 01/03/2018 1905 - Progressing by Milderd Meager, RN Cardiovascular complication will be avoided 01/03/2018 1905 - Progressing by Milderd Meager, RN Activity: Risk for activity intolerance will decrease 01/03/2018 1905 - Progressing by Milderd Meager, RN Nutrition: Adequate nutrition will be maintained 01/03/2018 1905 - Progressing by Milderd Meager, RN Coping: Level of anxiety will decrease 01/03/2018 1905 - Progressing by Milderd Meager, RN Elimination: Will not experience complications related to bowel motility 01/03/2018 1905 - Progressing by Milderd Meager, RN Will not experience complications related to urinary retention 01/03/2018 1905 - Progressing by Milderd Meager, RN Pain Managment: General experience of comfort will improve 01/03/2018 1905 - Progressing by Milderd Meager, RN Safety: Ability to remain free from injury will improve 01/03/2018 1905 - Progressing by Milderd Meager, RN Skin Integrity: Risk for impaired skin integrity will decrease 01/03/2018 1905 - Progressing by Milderd Meager, RN Education: Knowledge of the prescribed therapeutic regimen will improve 01/03/2018 1905 - Progressing by Milderd Meager, RN Activity: Ability to avoid complications of mobility impairment will  improve 01/03/2018 1905 - Progressing by Milderd Meager, RN Range of joint motion will improve 01/03/2018 1905 - Progressing by Milderd Meager, RN Clinical Measurements: Postoperative complications will be avoided or minimized 01/03/2018 1905 - Progressing by Milderd Meager, RN Pain Management: Pain level will decrease with appropriate interventions 01/03/2018 1905 - Progressing by Milderd Meager, RN Skin Integrity: Signs of wound healing will improve 01/03/2018 1905 - Progressing by Milderd Meager, RN

## 2018-01-03 NOTE — Transfer of Care (Signed)
Immediate Anesthesia Transfer of Care Note  Patient: Jason Mcknight  Procedure(s) Performed: LEFT TOTAL KNEE ARTHROPLASTY (Left Knee)  Patient Location: PACU  Anesthesia Type:Spinal  Level of Consciousness: awake, alert  and oriented  Airway & Oxygen Therapy: Patient Spontanous Breathing and Patient connected to face mask oxygen  Post-op Assessment: Report given to RN and Post -op Vital signs reviewed and stable  Post vital signs: Reviewed and stable  Last Vitals:  Vitals:   01/03/18 1358 01/03/18 1359  BP:    Pulse: 62 64  Resp: 11 15  Temp:    SpO2: 100% 100%    Last Pain:  Vitals:   01/03/18 1255  TempSrc: Oral         Complications: No apparent anesthesia complications

## 2018-01-03 NOTE — H&P (Signed)
TOTAL KNEE ADMISSION H&P  Patient is being admitted for left total knee arthroplasty.  Subjective:  Chief Complaint:left knee pain.  HPI: Jason Mcknight, 52 y.o. male, has a history of pain and functional disability in the left knee due to arthritis and has failed non-surgical conservative treatments for greater than 12 weeks to includeNSAID's and/or analgesics, corticosteriod injections, viscosupplementation injections and activity modification.  Onset of symptoms was gradual, starting 6 years ago with gradually worsening course since that time. The patient noted prior procedures on the knee to include  arthroscopy, menisectomy and ACL reconstruction on the left knee(s).  Patient currently rates pain in the left knee(s) at 9 out of 10 with activity. Patient has night pain, worsening of pain with activity and weight bearing, pain that interferes with activities of daily living, pain with passive range of motion and joint swelling.  Patient has evidence of subchondral cysts, subchondral sclerosis, periarticular osteophytes and joint space narrowing by imaging studies. This patient has had Failure of all reasonable conservative care. There is no active infection.  There are no active problems to display for this patient.  Past Medical History:  Diagnosis Date  . Anxiety   . Arthritis   . Asthma    as child  . GERD (gastroesophageal reflux disease)   . Headache   . Hypertension     Past Surgical History:  Procedure Laterality Date  . TONSILLECTOMY      No current facility-administered medications for this encounter.    Current Outpatient Medications  Medication Sig Dispense Refill Last Dose  . atenolol (TENORMIN) 50 MG tablet Take 100 mg by mouth every morning.     . lamoTRIgine (LAMICTAL) 200 MG tablet Take 200 mg by mouth at bedtime.     . lovastatin (MEVACOR) 20 MG tablet Take 40 mg by mouth at bedtime.     . Multiple Vitamins-Minerals (MULTIVITAMIN WITH MINERALS) tablet Take 1 tablet  by mouth daily.     Marland Kitchen ALPRAZolam (XANAX) 1 MG tablet Take 1 mg by mouth 2 (two) times daily as needed for anxiety.      No Known Allergies  Social History   Tobacco Use  . Smoking status: Former Smoker    Types: Cigarettes    Last attempt to quit: 11/02/2017    Years since quitting: 0.1  . Smokeless tobacco: Never Used  Substance Use Topics  . Alcohol use: Yes    Comment: occassionally    No family history on file.   ROS ROS: I have reviewed the patient's review of systems thoroughly and there are no positive responses as relates to the HPI. Objective:  Physical Exam  Vital signs in last 24 hours:   Well-developed well-nourished patient in no acute distress. Alert and oriented x3 HEENT:within normal limits Cardiac: Regular rate and rhythm Pulmonary: Lungs clear to auscultation Abdomen: Soft and nontender.  Normal active bowel sounds  Musculoskeletal: Left knee: Painful range of motion.  Limited range of motion.  Grinding through range of motion.  Neurovascular intact distally.  Labs:  Recent Results (from the past 2160 hour(s))  Surgical pcr screen     Status: None   Collection Time: 12/31/17  8:05 AM  Result Value Ref Range   MRSA, PCR NEGATIVE NEGATIVE   Staphylococcus aureus NEGATIVE NEGATIVE    Comment: (NOTE) The Xpert SA Assay (FDA approved for NASAL specimens in patients 24 years of age and older), is one component of a comprehensive surveillance program. It is not intended to diagnose  infection nor to guide or monitor treatment. Performed at [facility], [place] [address] [place], [place] [address]   Type and screen Order type and screen if day of surgery is less than 15 days from draw of preadmission visit or order morning of surgery if day of surgery is greater than 6 days from preadmission visit.     Status: None   Collection Time: 12/31/17  8:56 AM  Result Value Ref Range   ABO/RH(D) B POS    Antibody Screen NEG    Sample Expiration  01/06/2018    Extend sample reason      NO TRANSFUSIONS OR PREGNANCY IN THE PAST 3 MONTHS Performed at [facility], [place] [address], [place], [place] [address]   ABO/Rh     Status: None   Collection Time: 12/31/17  8:56 AM  Result Value Ref Range   ABO/RH(D)      B POS Performed at [facility], [place] [address] [place], [place] [address]   APTT     Status: None   Collection Time: 12/31/17  8:58 AM  Result Value Ref Range   aPTT 29 24 - 36 seconds    Comment: Performed at [facility], [place] [address], [place], [place] [address]  CBC WITH DIFFERENTIAL     Status: None   Collection Time: 12/31/17  8:58 AM  Result Value Ref Range   WBC 7.4 4.0 - 10.5 K/uL   RBC 4.60 4.22 - 5.81 MIL/uL   Hemoglobin 14.8 13.0 - 17.0 g/dL   HCT 42.3 39.0 - 52.0 %   MCV 92.0 78.0 - 100.0 fL   MCH 32.2 26.0 - 34.0 pg   MCHC 35.0 30.0 - 36.0 g/dL   RDW 12.6 11.5 - 15.5 %   Platelets 214 150 - 400 K/uL   Neutrophils Relative % 63 %   Neutro Abs 4.7 1.7 - 7.7 K/uL   Lymphocytes Relative 26 %   Lymphs Abs 1.9 0.7 - 4.0 K/uL   Monocytes Relative 10 %   Monocytes Absolute 0.8 0.1 - 1.0 K/uL   Eosinophils Relative 1 %   Eosinophils Absolute 0.1 0.0 - 0.7 K/uL   Basophils Relative 0 %   Basophils Absolute 0.0 0.0 - 0.1 K/uL    Comment: Performed at [facility], [place] [address] [place], [place] [address]  Comprehensive metabolic panel     Status: None   Collection Time: 12/31/17  8:58 AM  Result Value Ref Range   Sodium 139 135 - 145 mmol/L   Potassium 4.2 3.5 - 5.1 mmol/L   Chloride 103 101 - 111 mmol/L   CO2 28 22 - 32 mmol/L   Glucose, Bld 97 65 - 99 mg/dL   BUN 14 6 - 20 mg/dL   Creatinine, Ser 0.70 0.61 - 1.24 mg/dL   Calcium 9.4 8.9 - 10.3 mg/dL   Total Protein 7.4 6.5 - 8.1 g/dL   Albumin 4.4 3.5 - 5.0 g/dL   AST 29 15 - 41 U/L   ALT 35 17 - 63 U/L   Alkaline Phosphatase 65 38 - 126 U/L   Total Bilirubin 0.4 0.3  - 1.2 mg/dL   GFR calc non Af Amer >60 >60 mL/min   GFR calc Af Amer >60 >60 mL/min    Comment: (NOTE) The eGFR has been calculated using the CKD EPI equation. This calculation has not been validated in all clinical situations. eGFR's persistently <60 mL/min signify  possible Chronic Kidney Disease.    Anion gap 8 5 - 15    Comment: Performed at [facility], [place] [address], [place], [place] [address]  Protime-INR     Status: None   Collection Time: 12/31/17  8:58 AM  Result Value Ref Range   Prothrombin Time 13.4 11.4 - 15.2 seconds   INR 1.03     Comment: Performed at [facility], [place] [address] [place], [place] [address]  Urinalysis, Routine w reflex microscopic     Status: None   Collection Time: 12/31/17  8:58 AM  Result Value Ref Range   Color, Urine YELLOW YELLOW   APPearance CLEAR CLEAR   Specific Gravity, Urine 1.026 1.005 - 1.030   pH 5.0 5.0 - 8.0   Glucose, UA NEGATIVE NEGATIVE mg/dL   Hgb urine dipstick NEGATIVE NEGATIVE   Bilirubin Urine NEGATIVE NEGATIVE   Ketones, ur NEGATIVE NEGATIVE mg/dL   Protein, ur NEGATIVE NEGATIVE mg/dL   Nitrite NEGATIVE NEGATIVE   Leukocytes, UA NEGATIVE NEGATIVE    Comment: Performed at [place] [address] [place], [place] [address]    Estimated body mass index is 31.37 kg/m as calculated from the following:   Height as of 12/31/17: 5' 9"  (1.753 m).   Weight as of 12/31/17: 96.3 kg (212 lb 6.4 oz).   Imaging Review Plain radiographs demonstrate severe degenerative joint disease of the left knee(s). The overall alignment ismild varus. The bone quality appears to be fair for age and reported activity level.  Assessment/Plan:  End stage arthritis, left knee   The patient history, physical examination, clinical judgment of the provider and imaging studies are consistent with end stage degenerative joint disease of the left knee(s) and total knee arthroplasty is  deemed medically necessary. The treatment options including medical management, injection therapy arthroscopy and arthroplasty were discussed at length. The risks and benefits of total knee arthroplasty were presented and reviewed. The risks due to aseptic loosening, infection, stiffness, patella tracking problems, thromboembolic complications and other imponderables were discussed. The patient acknowledged the explanation, agreed to proceed with the plan and consent was signed. Patient is being admitted for inpatient treatment for surgery, pain control, PT, OT, prophylactic antibiotics, VTE prophylaxis, progressive ambulation and ADL's and discharge planning. The patient is planning to be discharged home with home health services

## 2018-01-03 NOTE — Anesthesia Procedure Notes (Signed)
Anesthesia Regional Block: Adductor canal block   Pre-Anesthetic Checklist: ,, timeout performed, Correct Patient, Correct Site, Correct Laterality, Correct Procedure, Correct Position, site marked, Risks and benefits discussed,  Surgical consent,  Pre-op evaluation,  At surgeon's request and post-op pain management  Laterality: Left  Prep: chloraprep       Needles:  Injection technique: Single-shot  Needle Type: Echogenic Needle     Needle Length: 9cm  Needle Gauge: 21     Additional Needles:   Procedures:,,,, ultrasound used (permanent image in chart),,,,  Narrative:  Start time: 01/03/2018 1:35 PM End time: 01/03/2018 1:45 PM Injection made incrementally with aspirations every 5 mL.  Performed by: Personally  Anesthesiologist: Barnet Glasgow, MD

## 2018-01-03 NOTE — Brief Op Note (Signed)
01/03/2018  4:52 PM  PATIENT:  Jason Mcknight  52 y.o. male  PRE-OPERATIVE DIAGNOSIS:  OSTEOARTHRITIS LEFT KNEE  POST-OPERATIVE DIAGNOSIS:  OSTEOARTHRITIS LEFT KNEE  PROCEDURE:  Procedure(s) with comments: LEFT TOTAL KNEE ARTHROPLASTY (Left) - Adductor Block  SURGEON:  Surgeon(s) and Role:    Dorna Leitz, MD - Primary  PHYSICIAN ASSISTANT:   ASSISTANTS: bethune   ANESTHESIA:   spinal  EBL:  minimal   BLOOD ADMINISTERED:none  DRAINS: none   LOCAL MEDICATIONS USED:  MARCAINE    and OTHER experel  SPECIMEN:  No Specimen  DISPOSITION OF SPECIMEN:  N/A  COUNTS:  YES  TOURNIQUET:   Total Tourniquet Time Documented: Thigh (Right) - 62 minutes Total: Thigh (Right) - 62 minutes   DICTATION: .Other Dictation: Dictation Number [id number]  PLAN OF CARE: Admit to inpatient   PATIENT DISPOSITION:  PACU - hemodynamically stable.   Delay start of Pharmacological VTE agent (>24hrs) due to surgical blood loss or risk of bleeding: no

## 2018-01-03 NOTE — Anesthesia Procedure Notes (Signed)
Spinal  Patient location during procedure: OB Start time: 01/03/2018 3:08 PM End time: 01/03/2018 3:17 PM Staffing Anesthesiologist: Barnet Glasgow, MD Performed: anesthesiologist  Preanesthetic Checklist Completed: patient identified, surgical consent, pre-op evaluation, timeout performed, IV checked, risks and benefits discussed and monitors and equipment checked Spinal Block Patient position: sitting Prep: site prepped and draped and DuraPrep Patient monitoring: heart rate, cardiac monitor, continuous pulse ox and blood pressure Approach: midline Location: L3-4 Injection technique: single-shot Needle Needle type: Pencan  Needle gauge: 24 G Needle length: 10 cm Assessment Sensory level: T4

## 2018-01-04 LAB — CBC
HEMATOCRIT: 37.5 % — AB (ref 39.0–52.0)
Hemoglobin: 12.6 g/dL — ABNORMAL LOW (ref 13.0–17.0)
MCH: 31.4 pg (ref 26.0–34.0)
MCHC: 33.6 g/dL (ref 30.0–36.0)
MCV: 93.5 fL (ref 78.0–100.0)
PLATELETS: 212 10*3/uL (ref 150–400)
RBC: 4.01 MIL/uL — ABNORMAL LOW (ref 4.22–5.81)
RDW: 12.5 % (ref 11.5–15.5)
WBC: 10.9 10*3/uL — AB (ref 4.0–10.5)

## 2018-01-04 MED ORDER — OXYCODONE HCL 5 MG PO TABS
5.0000 mg | ORAL_TABLET | ORAL | Status: DC | PRN
  Administered 2018-01-04: 09:00:00 10 mg via ORAL
  Administered 2018-01-04 (×2): 5 mg via ORAL
  Filled 2018-01-04 (×2): qty 2

## 2018-01-04 NOTE — Op Note (Signed)
NAMECHRISTYAN, Jason Mcknight NO.:  [id number]  MEDICAL RECORD NO.:  [id number]  LOCATION:  [facility]                         FACILITY:  [facility]  PHYSICIAN:  Alta Corning, M.D.   DATE OF BIRTH:  1966/02/20  DATE OF PROCEDURE:  01/03/2018 DATE OF DISCHARGE:                              OPERATIVE REPORT   PREOPERATIVE DIAGNOSIS:  End-stage degenerative joint disease of right knee with bone-on-bone change.  POSTOPERATIVE DIAGNOSIS:  End-stage degenerative joint disease of right knee with bone-on-bone change.  PROCEDURES: 1. Right total knee replacement with an Attune system, size 5 femur,     size 7 tibia, 5-mm bridging bearing, and a 38-mm all-polyethylene     patella. 2. Lateral retinacular release.  SURGEON:  Alta Corning, M.D.  ASSISTANT:  Jason Mcknight, P.A.  ANESTHESIA:  General.  BRIEF HISTORY:  Jason Mcknight is a 52 year old male with long history of having had ACL reconstruction on the left side many years ago.  He did well with his ACL reconstruction, but over the years, has had increasing pain.  We evaluated him in the office and noted to have bone-on-bone change on x-ray.  He was having night pain and light activity pain, and after failure of all conservative care, he was taken to the operating room for left total knee replacement.  DESCRIPTION OF PROCEDURE:  The patient was taken to the operating room, and after adequate anesthesia was obtained with __________ anesthetic, the patient was placed supine on the operating table.  The left leg was prepped and draped in usual sterile fashion.  Following this, the leg was exsanguinated.  Blood pressure tourniquet was inflated to 300 mmHg. Following this, a midline incision was made incorporating his old incision, subcutaneous tissue was dissected down to the level of the extensor mechanism and a medial parapatellar arthrotomy was undertaken. Retropatellar fat pad, synovium on the anterior aspect of the  femur, medial and lateral meniscus, and anterior and posterior cruciates were excised.  Following this, an intramedullary pilot hole was drilled in the femur and the guide was placed, 4-degree valgus alignment cut with a 9-degree distal bone resection.  Once this was done, attention was turned towards the sizing of the femur, sized it to a 5.  Anterior and posterior cuts were made, chamfers and box.  Attention was then turned to the tibia, cut perpendicular to its long axis.  It was drilled and keeled, and a 7 was the appropriate size before drilling and keeling. At this point, a 5-mm block was placed.  Excellent range of motion and stability were achieved.  The patella was cut down to a level of 13 mm and lugs were drilled for a 38-mm all-poly patella.  Interestingly, at this point, there was lateral subluxation of the patella, unclear exactly why, but we tried multiple times, tried towel clips and there still was not inability to get the patella to the groove.  There was some tight tissue there, I tried a little many lateral release initially and that did not really good as what I was looking for.  So, I did a formal lateral retinacular release at this point.  Once this was  achieved, the knee cap now was in the middle groove for alignment.  At this point, attention was turned towards removal of all trial components, the knee was copiously and thoroughly lavaged, pulsatile lavage irrigation and suctioned dry.  We did a posterior excision of bone and posterior capsular release and following this, attention was turned towards cementing in the final component, size 7 tibia, size 5 femur, a 38-mm all-poly patella was placed and held with a clamp, and attention was then turned towards the closure where after all the excess cement was removed, the tourniquet was let down.  All bleeding was controlled with electrocautery.  Exparel was instilled throughout the tissues for postoperative pain  control.  A medial parapatellar arthrotomy was closed at this point with 1 Vicryl running, the final 5 poly had been opened in place prior to this.  The skin was closed with 0 and 2-0 Vicryl and 3-0 Monocryl subcuticular.  Benzoin and Steri-Strips were applied.  Sterile compressive dressing was applied, and the patient was taken to the recovery room, was noted to be in satisfactory condition.  The estimated blood loss for the procedure is minimal.     Alta Corning, M.D.     Jason Mcknight  D:  01/03/2018  T:  01/04/2018  Job:  [id number]  cc:   Alta Corning, M.D. Fax: [phone]

## 2018-01-04 NOTE — Care Management Note (Signed)
Case Management Note  Patient Details  Name: Jason Mcknight MRN: [id number] Date of Birth: 1966-01-12  Subjective/Objective:   Left TKA                 Action/Plan: NCM spoke to pt and states outpt PT is arranged. He has RW at home. Wife at home to assist with care.   Expected Discharge Date:  01/04/18               Expected Discharge Plan:  OP Rehab  In-House Referral:  NA  Discharge planning Services  CM Consult  Post Acute Care Choice:  NA Choice offered to:  NA  DME Arranged:  N/A DME Agency:  NA  HH Arranged:  NA HH Agency:  NA  Status of Service:  Completed, signed off  If discussed at [place] of Stay Meetings, dates discussed:    Additional Comments:  Erenest Rasher, RN 01/04/2018, 12:57 PM

## 2018-01-04 NOTE — Discharge Summary (Signed)
Patient ID: Jason Mcknight MRN: [id number] DOB/AGE: 03-30-1966 52 y.o.  Admit date: 01/03/2018 Discharge date: 01/04/2018  Admission Diagnoses:  Principal Problem:   Primary osteoarthritis of left knee   Discharge Diagnoses:  Same  Past Medical History:  Diagnosis Date  . Anxiety   . Arthritis   . Asthma    as child  . GERD (gastroesophageal reflux disease)   . Headache   . Hypertension     Surgeries: Procedure(s): LEFT TOTAL KNEE ARTHROPLASTY on 01/03/2018   Consultants:   Discharged Condition: Improved  Hospital Course: Jason Mcknight is an 52 y.o. male who was admitted 01/03/2018 for operative treatment ofPrimary osteoarthritis of left knee. Patient has severe unremitting pain that affects sleep, daily activities, and work/hobbies. After pre-op clearance the patient was taken to the operating room on 01/03/2018 and underwent  Procedure(s): LEFT TOTAL KNEE ARTHROPLASTY.    Patient was given perioperative antibiotics:  Anti-infectives (From admission, onward)   Start     Dose/Rate Route Frequency Ordered Stop   01/03/18 2100  ceFAZolin (ANCEF) IVPB 2g/100 mL premix     2 g 200 mL/hr over 30 Minutes Intravenous Every 6 hours 01/03/18 1852 01/04/18 0344   01/03/18 1300  ceFAZolin (ANCEF) IVPB 2g/100 mL premix     2 g 200 mL/hr over 30 Minutes Intravenous On call to O.R. 01/03/18 1249 01/03/18 1552       Patient was given sequential compression devices, early ambulation, and chemoprophylaxis to prevent DVT.  Patient benefited maximally from hospital stay and there were no complications.    Recent vital signs:  Patient Vitals for the past 24 hrs:  BP Temp Temp src Pulse Resp SpO2 Height Weight  01/04/18 1140 128/77 98 F (36.7 C) Oral 76 18 98 % - -  01/04/18 0800 132/72 97.7 F (36.5 C) Oral 76 18 99 % - -  01/04/18 0555 (!) 144/84 97.6 F (36.4 C) Oral 75 20 99 % - -  01/04/18 0115 138/81 98.1 F (36.7 C) Oral 71 20 99 % - -  01/03/18 2240 137/76 97.7 F (36.5  C) Oral 82 16 100 % - -  01/03/18 2030 (!) 144/81 97.9 F (36.6 C) Oral 65 16 100 % - -  01/03/18 1930 133/83 97.6 F (36.4 C) Oral 62 16 99 % - -  01/03/18 1830 (!) 132/93 (!) 97.4 F (36.3 C) Oral (!) 50 16 100 % - -  01/03/18 1815 133/78 (!) 97.4 F (36.3 C) - (!) 47 13 100 % - -  01/03/18 1800 121/77 - - (!) 54 12 100 % - -  01/03/18 1745 126/78 - - (!) 50 17 100 % - -  01/03/18 1730 103/78 - - (!) 59 17 100 % - -  01/03/18 1725 (!) 102/58 (!) 97.4 F (36.3 C) - 65 12 100 % - -  01/03/18 1359 - - - 64 15 100 % - -  01/03/18 1358 - - - 62 11 100 % - -  01/03/18 1357 139/75 - - 62 15 100 % - -  01/03/18 1356 - - - 64 16 100 % - -  01/03/18 1355 - - - 63 16 100 % - -  01/03/18 1354 - - - 65 17 100 % - -  01/03/18 1353 - - - 67 14 100 % - -  01/03/18 1352 (!) 141/82 - - 64 16 99 % - -  01/03/18 1351 - - - 66 (!) 9 100 % - -  01/03/18 1350 - - - 63 13 99 % - -  01/03/18 1349 - - - 61 16 99 % - -  01/03/18 1348 - - - 65 12 98 % - -  01/03/18 1347 135/82 - - 66 12 99 % - -  01/03/18 1346 - - - 65 14 100 % - -  01/03/18 1345 - - - 67 15 100 % - -  01/03/18 1344 - - - 64 13 100 % - -  01/03/18 1343 - - - 68 15 100 % - -  01/03/18 1342 (!) 141/83 - - 70 13 100 % - -  01/03/18 1341 - - - 70 15 100 % - -  01/03/18 1340 - - - 67 11 100 % - -  01/03/18 1339 - - - (!) 59 13 100 % - -  01/03/18 1338 - - - 62 15 100 % - -  01/03/18 1337 (!) 160/88 - - 61 12 100 % - -  01/03/18 1336 - - - 60 (!) 9 100 % - -  01/03/18 1335 - - - (!) 59 13 98 % - -  01/03/18 1255 (!) 146/80 99 F (37.2 C) Oral 64 16 100 % 5\' 9"  (1.753 m) 96.3 kg (212 lb 6.4 oz)     Recent laboratory studies:  Recent Labs    01/04/18 0543  WBC 10.9*  HGB 12.6*  HCT 37.5*  PLT 212     Discharge Medications:   Allergies as of 01/04/2018   No Known Allergies     Medication List    TAKE these medications   ALPRAZolam 1 MG tablet Commonly known as:  XANAX Take 1 mg by mouth 2 (two) times daily as needed for  anxiety.   aspirin EC 325 MG tablet Take 1 tablet (325 mg total) by mouth 2 (two) times daily after a meal. Take x 1 month post op to decrease risk of blood clots.   atenolol 50 MG tablet Commonly known as:  TENORMIN Take 100 mg by mouth every morning.   docusate sodium 100 MG capsule Commonly known as:  COLACE Take 1 capsule (100 mg total) by mouth 2 (two) times daily.   lamoTRIgine 200 MG tablet Commonly known as:  LAMICTAL Take 200 mg by mouth at bedtime.   lovastatin 20 MG tablet Commonly known as:  MEVACOR Take 40 mg by mouth at bedtime.   multivitamin with minerals tablet Take 1 tablet by mouth daily.   oxyCODONE-acetaminophen 5-325 MG tablet Commonly known as:  PERCOCET/ROXICET Take 1-2 tablets by mouth every 6 (six) hours as needed for severe pain.   tiZANidine 2 MG tablet Commonly known as:  ZANAFLEX Take 1 tablet (2 mg total) by mouth every 8 (eight) hours as needed for muscle spasms.       Diagnostic Studies: Dg Chest 2 View  Result Date: 12/31/2017 CLINICAL DATA:  Preop knee arthroplasty. EXAM: CHEST - 2 VIEW COMPARISON:  No recent. FINDINGS: Mediastinum and hilar structures are normal. No focal infiltrate. No pleural effusion or pneumothorax. Heart size normal. Normal pulmonary vascularity. Thoracic spine degenerative change. Degenerative change thoracic spine. IMPRESSION: No acute cardiopulmonary disease. Electronically Signed   By: Marcello Moores  Register   On: 12/31/2017 12:02    Disposition: Discharge disposition: 01-Home or Self Care       Discharge Instructions    CPM   Complete by:  As directed    Continuous passive motion machine (CPM):      Use the  CPM from 0 to 60  for 5 hours per day.      You may increase by 10 degrees per day.  You may break it up into 2 or 3 sessions per day.      Use CPM for 2 weeks or until you are told to stop.   Call MD / Call 911   Complete by:  As directed    If you experience chest pain or shortness of breath, CALL  911 and be transported to the hospital emergency room.  If you develope a fever above 101 F, pus (white drainage) or increased drainage or redness at the wound, or calf pain, call your surgeon's office.   Constipation Prevention   Complete by:  As directed    Drink plenty of fluids.  Prune juice may be helpful.  You may use a stool softener, such as Colace (over the counter) 100 mg twice a day.  Use MiraLax (over the counter) for constipation as needed.   Diet - low sodium heart healthy   Complete by:  As directed    Driving restrictions   Complete by:  As directed    No driving for 2 weeks   Increase activity slowly as tolerated   Complete by:  As directed    Patient may shower   Complete by:  As directed    You may shower without a dressing once there is no drainage.  Do not wash over the wound.  If drainage remains, cover wound with plastic wrap and then shower.      Follow-up Information    Dorna Leitz, MD. Schedule an appointment as soon as possible for a visit in 2 week(s).   Specialty:  Orthopedic Surgery Contact information: [place] [address] [phone]        Follow up Follow up.   Why:  Go to [facility] at 340 pm On Monday Mar 18th.           Signed: Joanell Rising 01/04/2018, 12:33 PM

## 2018-01-04 NOTE — Evaluation (Signed)
Physical Therapy Evaluation Patient Details Name: Jason Mcknight MRN: [id number] DOB: [birth year] Today's Date: 01/04/2018   History of Present Illness  52 y/o male admitted for L TKA.  Clinical Impression  Patient presents with decreased mobility due to decreased ROM, strength and pain L LE.  Feel he can go home with family support and folllow up outpatient pt (reports has appointment for Monday).  No further skilled PT needs at this time.  Educated in [place], use if ice, pain meds, car transfers and mobility.      Follow Up Recommendations Follow surgeon's recommendation for DC plan and follow-up therapies    Equipment Recommendations  Rolling walker with 5" wheels;3in1 (PT)    Recommendations for Other Services       Precautions / Restrictions Precautions Precautions: Knee Restrictions Weight Bearing Restrictions: No LLE Weight Bearing: Weight bearing as tolerated      Mobility  Bed Mobility               General bed mobility comments: up in chair  Transfers Overall transfer level: Needs assistance Equipment used: Rolling walker (2 wheeled) Transfers: Sit to/from Stand Sit to Stand: Supervision         General transfer comment: cues for technique  Ambulation/Gait Ambulation/Gait assistance: Supervision;Min guard Ambulation Distance (Feet): 200 Feet Assistive device: Rolling walker (2 wheeled) Gait Pattern/deviations: Step-to pattern;Step-through pattern;Decreased stride length     General Gait Details: cues for heel strike, for rolling walker and more step through pattern  Stairs Stairs: Yes Stairs assistance: Min guard Stair Management: With walker;Forwards Number of Stairs: 1 General stair comments: cues for sequence  Wheelchair Mobility    Modified Rankin (Stroke Patients Only)       Balance Overall balance assessment: Needs assistance   Sitting balance-Leahy Scale: Good       Standing balance-Leahy Scale: Fair                                Pertinent Vitals/Pain Pain Assessment: 0-10 Pain Score: 8  Pain Location: L knee Pain Descriptors / Indicators: Aching Pain Intervention(s): Repositioned;Monitored during session;Ice applied    Home Living Family/patient expects to be discharged to:: Private residence Living Arrangements: Spouse/significant other Available Help at Discharge: Family Type of Home: House Home Access: Stairs to enter   Technical brewer of Steps: 1 Home Layout: Two level;Able to live on main level with bedroom/bathroom Home Equipment: None Additional Comments: states equipment has been ordered    Prior Function Level of Independence: Independent         Comments: works as Clinical biochemist        Extremity/Trunk Assessment   Upper Extremity Assessment Upper Extremity Assessment: Overall WFL for tasks assessed    Lower Extremity Assessment Lower Extremity Assessment: LLE deficits/detail LLE Deficits / Details: ankle AROM WFL, knee about -18 to 75, lifts antigravity       Communication   Communication: No difficulties  Cognition Arousal/Alertness: Awake/alert Behavior During Therapy: WFL for tasks assessed/performed Overall Cognitive Status: Within Functional Limits for tasks assessed                                        General Comments General comments (skin integrity, edema, etc.): discussed car transfer technique    Exercises Total Joint Exercises Ankle Circles/Pumps:  AROM;10 reps;Seated;Both Quad Sets: AROM;10 reps;Left;Seated Short Arc Quad: AROM;10 reps;Left;Seated Heel Slides: AROM;AAROM;10 reps;Left;Seated Hip ABduction/ADduction: AROM;Left;Seated;10 reps Straight Leg Raises: AROM;10 reps;Seated   Assessment/Plan    PT Assessment All further PT needs can be met in the next venue of care  PT Problem List Decreased range of motion;Decreased strength;Decreased mobility;Decreased safety awareness;Decreased  knowledge of use of DME;Decreased activity tolerance       PT Treatment Interventions      PT Goals (Current goals can be found in the Care Plan section)  Acute Rehab PT Goals Patient Stated Goal: to go home today PT Goal Formulation: All assessment and education complete, DC therapy    Frequency     Barriers to discharge        Co-evaluation               AM-PAC PT "6 Clicks" Daily Activity  Outcome Measure Difficulty turning over in bed (including adjusting bedclothes, sheets and blankets)?: None Difficulty moving from lying on back to sitting on the side of the bed? : A Little Difficulty sitting down on and standing up from a chair with arms (e.g., wheelchair, bedside commode, etc,.)?: A Little Help needed moving to and from a bed to chair (including a wheelchair)?: A Little Help needed walking in hospital room?: A Little Help needed climbing 3-5 steps with a railing? : A Little 6 Click Score: 19    End of Session Equipment Utilized During Treatment: Gait belt Activity Tolerance: Patient tolerated treatment well Patient left: with call bell/phone within reach;in chair   PT Visit Diagnosis: Difficulty in walking, not elsewhere classified (R26.2);Pain Pain - Right/Left: Left Pain - part of body: Knee    Time: 1005-1030 PT Time Calculation (min) (ACUTE ONLY): 25 min   Charges:   PT Evaluation $PT Eval Low Complexity: 1 Low PT Treatments $Gait Training: 8-22 mins   PT G CodesMagda Kiel, [place] [phone] 01/04/2018   Reginia Naas 01/04/2018, 1:23 PM

## 2018-01-04 NOTE — Plan of Care (Signed)
  Adequate for Discharge Education: Knowledge of General Education information will improve 01/04/2018 1604 - Adequate for Discharge by Milderd Meager, RN Health Behavior/Discharge Planning: Ability to manage health-related needs will improve 01/04/2018 1604 - Adequate for Discharge by Milderd Meager, RN Clinical Measurements: Ability to maintain clinical measurements within normal limits will improve 01/04/2018 1604 - Adequate for Discharge by Milderd Meager, RN Will remain free from infection 01/04/2018 1604 - Adequate for Discharge by Milderd Meager, RN Diagnostic test results will improve 01/04/2018 1604 - Adequate for Discharge by Milderd Meager, RN Respiratory complications will improve 01/04/2018 1604 - Adequate for Discharge by Milderd Meager, RN Cardiovascular complication will be avoided 01/04/2018 1604 - Adequate for Discharge by Milderd Meager, RN Activity: Risk for activity intolerance will decrease 01/04/2018 1604 - Adequate for Discharge by Milderd Meager, RN Nutrition: Adequate nutrition will be maintained 01/04/2018 1604 - Adequate for Discharge by Milderd Meager, RN Coping: Level of anxiety will decrease 01/04/2018 1604 - Adequate for Discharge by Milderd Meager, RN Elimination: Will not experience complications related to bowel motility 01/04/2018 1604 - Adequate for Discharge by Milderd Meager, RN Will not experience complications related to urinary retention 01/04/2018 1604 - Adequate for Discharge by Milderd Meager, RN Pain Managment: General experience of comfort will improve 01/04/2018 1604 - Adequate for Discharge by Milderd Meager, RN Safety: Ability to remain free from injury will improve 01/04/2018 1604 - Adequate for Discharge by Milderd Meager, RN Skin Integrity: Risk for impaired skin integrity will decrease 01/04/2018 1604 - Adequate for Discharge by Milderd Meager, RN Education: Knowledge of the  prescribed therapeutic regimen will improve 01/04/2018 1604 - Adequate for Discharge by Milderd Meager, RN Activity: Ability to avoid complications of mobility impairment will improve 01/04/2018 1604 - Adequate for Discharge by Milderd Meager, RN Range of joint motion will improve 01/04/2018 1604 - Adequate for Discharge by Milderd Meager, RN Clinical Measurements: Postoperative complications will be avoided or minimized 01/04/2018 1604 - Adequate for Discharge by Milderd Meager, RN Pain Management: Pain level will decrease with appropriate interventions 01/04/2018 1604 - Adequate for Discharge by Milderd Meager, RN Skin Integrity: Signs of wound healing will improve 01/04/2018 1604 - Adequate for Discharge by Milderd Meager, RN

## 2018-01-04 NOTE — Progress Notes (Signed)
PATIENT ID: Jason Mcknight  MRN: [id number]  DOB/AGE:  [birth year] / 52 y.o.  1 Day Post-Op Procedure(s) (LRB): LEFT TOTAL KNEE ARTHROPLASTY (Left)    PROGRESS NOTE Subjective: Patient is alert, oriented, no Nausea, no Vomiting, yes passing gas. Taking PO well. Denies SOB, Chest or Calf Pain. Using Incentive Spirometer, PAS in place. Ambulate WBAT, Patient reports pain as 7/10 .    Objective: Vital signs in last 24 hours: Vitals:   01/03/18 2030 01/03/18 2240 01/04/18 0115 01/04/18 0555  BP: (!) 144/81 137/76 138/81 (!) 144/84  Pulse: 65 82 71 75  Resp: 16 16 20 20   Temp: 97.9 F (36.6 C) 97.7 F (36.5 C) 98.1 F (36.7 C) 97.6 F (36.4 C)  TempSrc: Oral Oral Oral Oral  SpO2: 100% 100% 99% 99%  Weight:      Height:          Intake/Output from previous day: I/O last 3 completed shifts: In: 2285 [I.V.:2030; IV Piggyback:255] Out: 2130 [Urine:1325; Blood:50]   Intake/Output this shift: No intake/output data recorded.   LABORATORY DATA: Recent Labs    01/04/18 0543  WBC 10.9*  HGB 12.6*  HCT 37.5*  PLT 212    Examination: Neurologically intact Neurovascular intact Sensation intact distally Intact pulses distally Dorsiflexion/Plantar flexion intact Incision: dressing C/D/I and no drainage No cellulitis present Compartment soft}  Assessment:   1 Day Post-Op Procedure(s) (LRB): LEFT TOTAL KNEE ARTHROPLASTY (Left) ADDITIONAL DIAGNOSIS: Expected Acute Blood Loss Anemia,   Plan: PT/OT WBAT, AROM and PROM  DVT Prophylaxis:  SCDx72hrs, ASA 325 mg BID x 2 weeks DISCHARGE PLAN: Home DISCHARGE NEEDS: Walker and 3-in-1 comode seat.  Pt plans on starting out pt therapy next week     Joanell Rising 01/04/2018, 7:52 AM

## 2018-01-05 NOTE — Anesthesia Postprocedure Evaluation (Signed)
Anesthesia Post Note  Patient: Jason Mcknight  Procedure(s) Performed: LEFT TOTAL KNEE ARTHROPLASTY (Left Knee)     Patient location during evaluation: PACU Anesthesia Type: Spinal Level of consciousness: oriented and awake and alert Pain management: pain level controlled Vital Signs Assessment: post-procedure vital signs reviewed and stable Respiratory status: spontaneous breathing, respiratory function stable and patient connected to nasal cannula oxygen Cardiovascular status: blood pressure returned to baseline and stable Postop Assessment: no headache, no backache and no apparent nausea or vomiting Anesthetic complications: no    Last Vitals:  Vitals:   01/04/18 0800 01/04/18 1140  BP: 132/72 128/77  Pulse: 76 76  Resp: 18 18  Temp: 36.5 C 36.7 C  SpO2: 99% 98%    Last Pain:  Vitals:   01/04/18 1140  TempSrc: Oral  PainSc:                  Barnet Glasgow

## 2018-01-09 NOTE — Op Note (Signed)
NAME:  LATHON, ADAN NO.:  [id number]  MEDICAL RECORD NO.:  [id number]  LOCATION:  [facility]                         FACILITY:  [facility]  PHYSICIAN:  Alta Corning, M.D.   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  PROCEDURE PERFORMED:  Left total knee replacement with an Attune System size 5 femur, size 7 tibia, 5-mm bridging bearing, and a 38 all- polyethylene patella.     Alta Corning, M.D.     Corliss Skains  D:  01/09/2018  T:  01/09/2018  Job:  [id number]

## 2018-01-24 ENCOUNTER — Other Ambulatory Visit: Payer: Self-pay | Admitting: Orthopedic Surgery

## 2018-01-24 DIAGNOSIS — M1812 Unilateral primary osteoarthritis of first carpometacarpal joint, left hand: Secondary | ICD-10-CM

## 2018-01-27 ENCOUNTER — Ambulatory Visit
Admission: RE | Admit: 2018-01-27 | Discharge: 2018-01-27 | Disposition: A | Discharge status: Home / Self Care | Payer: 59 | Source: Ambulatory Visit | Attending: Orthopedic Surgery | Admitting: Orthopedic Surgery

## 2018-01-27 DIAGNOSIS — M1812 Unilateral primary osteoarthritis of first carpometacarpal joint, left hand: Secondary | ICD-10-CM

## 2018-11-28 ENCOUNTER — Encounter: Payer: Self-pay | Admitting: Family Medicine

## 2018-11-28 ENCOUNTER — Ambulatory Visit (INDEPENDENT_AMBULATORY_CARE_PROVIDER_SITE_OTHER): Payer: 59 | Admitting: Family Medicine

## 2018-11-28 VITALS — BP 120/60 | HR 74 | Temp 98.3°F | Ht 69.0 in | Wt 204.2 lb

## 2018-11-28 DIAGNOSIS — I10 Essential (primary) hypertension: Secondary | ICD-10-CM | POA: Diagnosis not present

## 2018-11-28 DIAGNOSIS — F419 Anxiety disorder, unspecified: Secondary | ICD-10-CM

## 2018-11-28 DIAGNOSIS — Z1211 Encounter for screening for malignant neoplasm of colon: Secondary | ICD-10-CM

## 2018-11-28 DIAGNOSIS — E785 Hyperlipidemia, unspecified: Secondary | ICD-10-CM

## 2018-11-28 MED ORDER — ALPRAZOLAM 1 MG PO TABS: 1.0000 mg | ORAL_TABLET | Freq: Every day | ORAL | 2 refills | Status: DC | PRN

## 2018-11-28 NOTE — Progress Notes (Addendum)
Jason Mcknight DOB: November 23, 1965 Encounter date: 11/28/2018  This isa 53 y.o. male who presents to establish care. Chief Complaint  Patient presents with  . New Patient (Initial Visit)    History of present illness: Coming from [place] in [place] for years. Ready for a change.  Had left total knee in March. Just had thumb surgery end of December  HTN: Stable on the atenolol. States that this was started more for family history.  GERD: not taking any medication at this point.  Asthma: as child; not had issues since childhood Arthritis: following with ortho Anxiety: has been taking the xanax for years. Some days he doesn't take medication. Not seen psychiatry in the past. Did try klonopin but didn't feel that was effective. Usually takes medication at night; hard to turn off thoughts. At one point was up to 2 per day. Has tried breaking in half. Has been on the lamictal for years as well.   Didn't end up completing colonoscopy. [place] between PT and post surgical. Needs new referral.   Past Medical History:  Diagnosis Date  . Anxiety   . Arthritis   . Asthma    as child  . GERD (gastroesophageal reflux disease)   . Headache   . Hypertension    Past Surgical History:  Procedure Laterality Date  . DG THUMB LEFT HAND    . TONSILLECTOMY    . TOTAL KNEE ARTHROPLASTY Left 01/03/2018   Procedure: LEFT TOTAL KNEE ARTHROPLASTY;  Surgeon: Dorna Leitz, MD;  Location: [facility];  Service: Orthopedics;  Laterality: Left;  Adductor Block   No Known Allergies Current Meds  Medication Sig  . ALPRAZolam (XANAX) 1 MG tablet Take 1 tablet (1 mg total) by mouth daily as needed for anxiety or sleep.  Marland Kitchen atenolol (TENORMIN) 50 MG tablet Take 100 mg by mouth every morning.  . lamoTRIgine (LAMICTAL) 200 MG tablet Take 200 mg by mouth at bedtime.  . Multiple Vitamins-Minerals (MULTIVITAMIN WITH MINERALS) tablet Take 1 tablet by mouth daily.  . [DISCONTINUED] ALPRAZolam (XANAX) 1 MG tablet Take 1 mg by mouth  2 (two) times daily as needed for anxiety.  . [DISCONTINUED] aspirin EC 325 MG tablet Take 1 tablet (325 mg total) by mouth 2 (two) times daily after a meal. Take x 1 month post op to decrease risk of blood clots.  . [DISCONTINUED] docusate sodium (COLACE) 100 MG capsule Take 1 capsule (100 mg total) by mouth 2 (two) times daily.  . [DISCONTINUED] lovastatin (MEVACOR) 20 MG tablet Take 40 mg by mouth at bedtime.  . [DISCONTINUED] oxyCODONE-acetaminophen (PERCOCET/ROXICET) 5-325 MG tablet Take 1-2 tablets by mouth every 6 (six) hours as needed for severe pain.  . [DISCONTINUED] tiZANidine (ZANAFLEX) 2 MG tablet Take 1 tablet (2 mg total) by mouth every 8 (eight) hours as needed for muscle spasms.   Social History   Tobacco Use  . Smoking status: Former Smoker    Types: Cigarettes    Last attempt to quit: 11/02/2017    Years since quitting: 1.0  . Smokeless tobacco: Never Used  Substance Use Topics  . Alcohol use: Yes    Comment: 2 beers/night.    Family History  Problem Relation Age of Onset  . CAD Mother 13  . Diabetes Father   . Liver disease Father      Review of Systems  Constitutional: Negative for chills, fatigue and fever.  Respiratory: Negative for cough, chest tightness, shortness of breath and wheezing.   Cardiovascular: Negative for  chest pain, palpitations and leg swelling.  Psychiatric/Behavioral: Nervous/anxious: feels that this is stable; see above.     Objective:  BP 120/60 (BP Location: Left Arm, Patient Position: Sitting, Cuff Size: Normal)   Pulse 74   Temp 98.3 F (36.8 C) (Oral)   Ht 5\' 9"  (1.753 m)   Wt 204 lb 3.2 oz (92.6 kg)   SpO2 96%   BMI 30.16 kg/m   Weight: 204 lb 3.2 oz (92.6 kg)   BP Readings from Last 3 Encounters:  11/28/18 120/60  01/04/18 128/77  12/31/17 136/83   Wt Readings from Last 3 Encounters:  11/28/18 204 lb 3.2 oz (92.6 kg)  01/03/18 212 lb 6.4 oz (96.3 kg)  12/31/17 212 lb 6.4 oz (96.3 kg)    Physical  Exam Constitutional:      General: He is not in acute distress.    Appearance: He is well-developed.  Cardiovascular:     Rate and Rhythm: Normal rate and regular rhythm.     Heart sounds: Normal heart sounds. No murmur. No friction rub.  Pulmonary:     Effort: Pulmonary effort is normal. No respiratory distress.     Breath sounds: Normal breath sounds. No wheezing or rales.  Musculoskeletal:     Right lower leg: No edema.     Left lower leg: No edema.  Neurological:     Mental Status: He is alert and oriented to person, place, and time.  Psychiatric:        Mood and Affect: Mood normal.        Behavior: Behavior normal.     Assessment/Plan: 1. Anxiety Discussed some risks of the xanax. He does not take daily. I have changed rx to daily prn. Suggested to continue to try and not take when able (ok to take 1/2 tab instead of whole); will continue to address as I do not prefer this for long term anxiety treatment. Have requested records and will review when these available. [place] controlled substances database was reviewed. Pain medication through ortho for recent surgeries. Alprazolam not filled since 09/2018 for 60 tablets.    2. Screening for colon cancer - Ambulatory referral to Gastroenterology  3. HTN: controlled; continue current medication.   Return in about 3 months (around 02/26/2019) for physical exam.  Micheline Rough, MD  Records requested. Will review bloodwork when I get these. He thinks it has been 2 years since last bloodwork? Encouraged him to call if he hasn't heard from me regarding bloowork in 2 months time.

## 2018-12-19 ENCOUNTER — Encounter: Payer: Self-pay | Admitting: Family Medicine

## 2018-12-22 ENCOUNTER — Ambulatory Visit (INDEPENDENT_AMBULATORY_CARE_PROVIDER_SITE_OTHER): Payer: 59 | Admitting: Family Medicine

## 2018-12-22 ENCOUNTER — Ambulatory Visit (INDEPENDENT_AMBULATORY_CARE_PROVIDER_SITE_OTHER): Payer: 59

## 2018-12-22 ENCOUNTER — Encounter: Payer: Self-pay | Admitting: Family Medicine

## 2018-12-22 VITALS — BP 120/78 | HR 68 | Temp 98.4°F | Ht 69.0 in | Wt 210.9 lb

## 2018-12-22 DIAGNOSIS — W19XXXA Unspecified fall, initial encounter: Secondary | ICD-10-CM | POA: Diagnosis not present

## 2018-12-22 DIAGNOSIS — R0789 Other chest pain: Secondary | ICD-10-CM | POA: Diagnosis not present

## 2018-12-22 NOTE — Patient Instructions (Addendum)
BEFORE YOU LEAVE: -xray -follow up: 1-2 weeks  I hope you are feeling better soon! Seek care promptly if your symptoms worsen, new concerns arise or you are not continuing to improve.

## 2018-12-22 NOTE — Progress Notes (Signed)
  HPI:  Using dictation device. Unfortunately this device frequently misinterprets words/phrases.  Acute visit for R lat chest wall pain: -started after fall on this side on gravel a few days ago -was carrying bags and on icy gravel -has had pain on this side since if tists certain ways or takes a very deep breath -no bruising, deformity of chest wall, sob, dyspnea, fevers, malaise, cough, hemoptysis  ROS: See pertinent positives and negatives per HPI.  Past Medical History:  Diagnosis Date  . Anxiety   . Arthritis   . Asthma    as child  . GERD (gastroesophageal reflux disease)   . Headache   . Hypertension     Past Surgical History:  Procedure Laterality Date  . DG THUMB LEFT HAND    . TONSILLECTOMY    . TOTAL KNEE ARTHROPLASTY Left 01/03/2018   Procedure: LEFT TOTAL KNEE ARTHROPLASTY;  Surgeon: Dorna Leitz, MD;  Location: [facility];  Service: Orthopedics;  Laterality: Left;  Adductor Block    Family History  Problem Relation Age of Onset  . CAD Mother 47  . Diabetes Father   . Liver disease Father     SOCIAL HX: see hpi   Current Outpatient Medications:  .  ALPRAZolam (XANAX) 1 MG tablet, Take 1 tablet (1 mg total) by mouth daily as needed for anxiety or sleep., Disp: 30 tablet, Rfl: 2 .  atenolol (TENORMIN) 50 MG tablet, Take 100 mg by mouth every morning., Disp: , Rfl:  .  lamoTRIgine (LAMICTAL) 200 MG tablet, Take 200 mg by mouth at bedtime., Disp: , Rfl:  .  Multiple Vitamins-Minerals (MULTIVITAMIN WITH MINERALS) tablet, Take 1 tablet by mouth daily., Disp: , Rfl:   EXAM:  Vitals:   12/22/18 1109  BP: 120/78  Pulse: 68  Temp: 98.4 F (36.9 C)  SpO2: 97%    Body mass index is 31.14 kg/m.  GENERAL: vitals reviewed and listed above, alert, oriented, appears well hydrated and in no acute distress  HEENT: atraumatic, conjunttiva clear, no obvious abnormalities on inspection of external nose and ears  NECK: no obvious masses on inspection  LUNGS: clear  to auscultation bilaterally, no wheezes, rales or rhonchi, good air movement  CV: HRRR, no peripheral edema  MS: moves all extremities without noticeable abnormality, mild TTP in the R lat chest wall, mainly in the soft tissues, > a few ribs a little ttp as well r mid lat chest wall, no overlying bruising or appreciable deformity  PSYCH: pleasant and cooperative, no obvious depression or anxiety  ASSESSMENT AND PLAN:  Discussed the following assessment and plan:  Chest wall pain - Plan: DG Ribs Unilateral Right  Fall, initial encounter  -he seems quite concerns about a possible fx, discussed options for evaluation, potential etiologies -he opted for xray, gentle stretching, normal activities - prn otx analgesic though for now reports doesn't really need to take anything -advised follow up in 1-2 weeks for recheck -Patient advised to return or notify a doctor immediately if symptoms worsen or persist or new concerns arise.  Patient Instructions  BEFORE YOU LEAVE: -xray -follow up: 1-2 weeks  I hope you are feeling better soon! Seek care promptly if your symptoms worsen, new concerns arise or you are not continuing to improve.      Lucretia Kern, DO

## 2018-12-23 ENCOUNTER — Telehealth: Payer: Self-pay | Admitting: Family Medicine

## 2018-12-23 NOTE — Telephone Encounter (Signed)
Please advise. This x-ray was ordered by Dr.Kim

## 2018-12-23 NOTE — Telephone Encounter (Signed)
Copied from [place] [id number]. Topic: General - Other >> Dec 23, 2018  2:04 PM Keene Breath wrote: Reason for CRM: Patient called to request the results of his x-rays.  Patient stated that he was told he would have the results yesterday.  Please advise and call patient back.  CB# [phone]

## 2018-12-23 NOTE — Telephone Encounter (Signed)
I left a detailed message at the pts cell number with the results as Dr Maudie Mercury sent the results via [place] message on 3/2.

## 2018-12-30 ENCOUNTER — Encounter: Payer: Self-pay | Admitting: Family Medicine

## 2019-01-05 ENCOUNTER — Other Ambulatory Visit: Payer: Self-pay | Admitting: Family Medicine

## 2019-01-05 NOTE — Telephone Encounter (Signed)
Copied from [place] [id number]. Topic: Quick Communication - Rx Refill/Question >> Jan 05, 2019  4:57 PM Waylan Rocher, Lumin L wrote: Medication: ALPRAZolam (XANAX) 1 MG tablet, atenolol (TENORMIN) 50 MG tablet, lamoTRIgine (LAMICTAL) 200 MG tablet (90 day supply on everything)  Has the patient contacted their pharmacy? Yes.   (Agent: If no, request that the patient contact the pharmacy for the refill.) (Agent: If yes, when and what did the pharmacy advise?)  Preferred Pharmacy (with phone number or street name): [facility] [address] [place] - [address] [place] [address] Phone: [phone] Fax: [phone]  Agent: Please be advised that RX refills may take up to 3 business days. We ask that you follow-up with your pharmacy.

## 2019-01-06 NOTE — Telephone Encounter (Signed)
Requested medication (s) are due for refill today: yes  Requested medication (s) are on the active medication list: yes  Last refill:  Atenolol last refilled by historical provider  Future visit scheduled: no  Notes to clinic:  Unable to refill per protocol.      Requested Prescriptions  Pending Prescriptions Disp Refills   ALPRAZolam (XANAX) 1 MG tablet 30 tablet 2    Sig: Take 1 tablet (1 mg total) by mouth daily as needed for anxiety or sleep.     Not Delegated - Psychiatry:  Anxiolytics/Hypnotics Failed - 01/06/2019  8:54 AM      Failed - This refill cannot be delegated      Failed - Urine Drug Screen completed in last 360 days.      Passed - Valid encounter within last 6 months    Recent Outpatient Visits          2 weeks ago Chest wall pain   [place] [facility] [place], DO   1 month ago [place] at [facility], Steele Berg, MD            lamoTRIgine (LAMICTAL) 200 MG tablet      Sig: Take 1 tablet (200 mg total) by mouth at bedtime.     Not Delegated - Neurology:  Anticonvulsants Failed - 01/06/2019  8:54 AM      Failed - This refill cannot be delegated      Failed - HCT in normal range and within 360 days    HCT  Date Value Ref Range Status  01/04/2018 37.5 (L) 39.0 - 52.0 % Final         Failed - HGB in normal range and within 360 days    Hemoglobin  Date Value Ref Range Status  01/04/2018 12.6 (L) 13.0 - 17.0 g/dL Final         Failed - PLT in normal range and within 360 days    Platelets  Date Value Ref Range Status  01/04/2018 212 150 - 400 K/uL Final    Comment:    Performed at [facility], [place] [address], [place], [place] [address]         Failed - WBC in normal range and within 360 days    WBC  Date Value Ref Range Status  01/04/2018 10.9 (H) 4.0 - 10.5 K/uL Final         Passed - Valid encounter within last 12 months    Recent Outpatient Visits          2 weeks ago  Chest wall pain   [place] at [place], DO   1 month ago [place] at [facility], Steele Berg, MD            atenolol (TENORMIN) 50 MG tablet      Sig: Take 2 tablets (100 mg total) by mouth every morning.     Cardiovascular:  Beta Blockers Passed - 01/06/2019  8:54 AM      Passed - Last BP in normal range    BP Readings from Last 1 Encounters:  12/22/18 120/78         Passed - Last Heart Rate in normal range    Pulse Readings from Last 1 Encounters:  12/22/18 68         Passed - Valid encounter within last 6 months    Recent Outpatient Visits  2 weeks ago Chest wall pain   [place] at [facility], [place], DO   1 month ago [place] at [facility], Steele Berg, MD

## 2019-01-08 MED ORDER — ALPRAZOLAM 1 MG PO TABS: 1.0000 mg | ORAL_TABLET | Freq: Every day | ORAL | 0 refills | Status: DC | PRN

## 2019-01-08 MED ORDER — ATENOLOL 100 MG PO TABS: 100.0000 mg | ORAL_TABLET | ORAL | 1 refills | Status: DC

## 2019-01-08 MED ORDER — LAMOTRIGINE 200 MG PO TABS: 200.0000 mg | ORAL_TABLET | Freq: Every day | ORAL | 1 refills | Status: DC

## 2019-01-08 NOTE — Telephone Encounter (Signed)
Last OV 11/28/2018 Last fill for Xanax 11/28/2018 All others have never been filled by you.  Ok to fill?

## 2019-01-08 NOTE — Telephone Encounter (Signed)
We had discussed limiting xanax use at visit. I have sent in refill, but I will continue to taper down, so limit use to when most needed. If he feels that he will continue to need xanax on close to regular basis he will need to see psychiatry (ok to put in referral).

## 2019-01-26 ENCOUNTER — Telehealth: Payer: Self-pay | Admitting: Family Medicine

## 2019-01-26 NOTE — Telephone Encounter (Signed)
Copied from [place] [id number]. Topic: Quick Communication - Rx Refill/Question >> Jan 26, 2019  4:31 PM Margot Ables wrote: Medication: [name] 1 MG tablet  - pt is out - wanting to change to different pharmacy because [facility] does not have a drive thru - taking 1/day (states he was taking 2-3/day)  Has the patient contacted their pharmacy? Yes - wanting to change pharmacy - pt does not think he picked up from [facility] 01/08/19 RX  Preferred Pharmacy (with phone number or street name): [id number] - [place], [place] [phone] (Phone) [phone] (Fax)

## 2019-01-27 NOTE — Telephone Encounter (Signed)
Please advise. Ok to change pharmacies?

## 2019-01-27 NOTE — Telephone Encounter (Signed)
Jason Mcknight, This is a controlled medication, and he is requesting a early refill. That will need to from from his doctor. Please call the pt and let him know this and that his doctor will return Monday.  He will likely need a Virtual visit with his doctor.  If he feels he needs help for anxiety or sleep prior to Monday, please schedule him a virtual visit with one of our providers. Thank you.

## 2019-01-28 NOTE — Telephone Encounter (Signed)
Spoke with patient. He is aware that Dr. Ethlyn Gallery is out of the office until Monday.

## 2019-01-30 NOTE — Telephone Encounter (Signed)
Looks like he ended up filling the [facility] at [facility] per controlled substance data base. He has not been using more than one daily according to fill rate (message made it sound like he was using more?). It would be ok to switch med to different pharmacy for future refills.

## 2019-02-02 NOTE — Telephone Encounter (Signed)
Patient is taking the medication as instructed. Pharmacy's no longer accept controlled substances over the phone or by fax, with will have to be sent electronically.

## 2019-02-02 NOTE — Telephone Encounter (Signed)
I am saying that he filled the rx on 4/8 already at the pharmacy. So he doesn't need a refill at this time. If he prefers to change pharmacy in the future please chance in system so we do not sent to incorrect pharmacy when due for refill.

## 2019-02-02 NOTE — Telephone Encounter (Signed)
Noted  

## 2019-02-18 ENCOUNTER — Other Ambulatory Visit: Payer: Self-pay | Admitting: Family Medicine

## 2019-02-18 NOTE — Telephone Encounter (Signed)
Copied from [place] [id number]. Topic: Quick Communication - Rx Refill/Question >> Feb 18, 2019  5:42 PM Nils Flack, Marland Kitchen wrote: Medication: ALPRAZolam (XANAX) 1 MG tablet, lamoTRIgine (LAMICTAL) 200 MG tab, lovastatin Has the patient contacted their pharmacy? Yes.   (Agent: If no, request that the patient contact the pharmacy for the refill.) (Agent: If yes, when and what did the pharmacy advise?)  Preferred Pharm[facility] phone number [facility] [place]  Agent: Please be advised that RX refills may take up to 3 business days. We ask that you follow-up with your pharmacy.

## 2019-02-20 MED ORDER — LOVASTATIN 20 MG PO TABS: 40.0000 mg | ORAL_TABLET | Freq: Every day | ORAL | 0 refills | Status: DC

## 2019-02-20 MED ORDER — LAMOTRIGINE 200 MG PO TABS: 200.0000 mg | ORAL_TABLET | Freq: Every day | ORAL | 1 refills | Status: DC

## 2019-02-20 MED ORDER — ALPRAZOLAM 1 MG PO TABS: 0.5000 mg | ORAL_TABLET | Freq: Every day | ORAL | 0 refills | Status: DC | PRN

## 2019-02-20 NOTE — Telephone Encounter (Signed)
I did refill meds.   Please schedule doxy follow up with him for mood. We were supposed to do in person physical, but since we are limiting these visits and he is continuing need for controlled substance (xanax) I would like to touch base with him.

## 2019-02-23 NOTE — Telephone Encounter (Signed)
Patient returned call. Patient scheduled for virtual visit Friday at 2:30 PM.

## 2019-02-23 NOTE — Telephone Encounter (Signed)
I left a message for the pt to return my call. 

## 2019-02-24 NOTE — Telephone Encounter (Signed)
Noted  

## 2019-02-27 ENCOUNTER — Ambulatory Visit (INDEPENDENT_AMBULATORY_CARE_PROVIDER_SITE_OTHER): Payer: 59 | Admitting: Family Medicine

## 2019-02-27 ENCOUNTER — Other Ambulatory Visit: Payer: Self-pay

## 2019-02-27 ENCOUNTER — Encounter: Payer: Self-pay | Admitting: Family Medicine

## 2019-02-27 DIAGNOSIS — E785 Hyperlipidemia, unspecified: Secondary | ICD-10-CM | POA: Diagnosis not present

## 2019-02-27 DIAGNOSIS — I10 Essential (primary) hypertension: Secondary | ICD-10-CM

## 2019-02-27 DIAGNOSIS — F419 Anxiety disorder, unspecified: Secondary | ICD-10-CM

## 2019-02-27 NOTE — Progress Notes (Signed)
Virtual Visit via Video Note  I connected with Jason Mcknight  on 02/27/19 at  2:30 PM EDT by a video enabled telemedicine application and verified that I am speaking with the correct person using two identifiers.  Location patient: home Location provider:work or home office Persons participating in the virtual visit: patient, provider  I discussed the limitations of evaluation and management by telemedicine and the availability of in person appointments. The patient expressed understanding and agreed to proceed.  Due to technical difficulties with video-audio feed this visit was completed via telephone. Length of call documented below. Patient missed appointment time and didn't answer phone. I was able to connect with him on 3rd phone call attempt.    Jason Mcknight DOB: 01-02-1966 Encounter date: 02/27/2019  This is a 53 y.o. male who presents with Chief Complaint  Patient presents with  . Medication Refill    History of present illness: Anxiety: has been on xanax for years; at last visit stated he doesn't always take daily. Not seen psychiatry in past. Usually taking at night. Has been on lamictal for years.   Still taking the xanax in the evenings. 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 Would just leave work if he felt anxiety was severe. Feels like he might need to take more than one tablet sometimes in evening to help with anxiety. COVID has been more stressful for him. Trying to deal with work, child. Using babysitter to help with childcare and home schooling at this point. Does still feel like the xanax helps when needed. Have been days he has gone without taking medication; but usually not skipping doses.   Last bloodwork was 07/2017. Hasn't had any since this time. Previous records were reviewed from [place].  Continues on atenolol for blood pressure.   Has been taking lovastatin for cholesterol. No lipid panel in last year.    No Known  Allergies Current Meds  Medication Sig  . ALPRAZolam (XANAX) 1 MG tablet Take 0.5-1 tablets (0.5-1 mg total) by mouth daily as needed for anxiety or sleep.  Marland Kitchen atenolol (TENORMIN) 100 MG tablet Take 1 tablet (100 mg total) by mouth every morning.  . lamoTRIgine (LAMICTAL) 200 MG tablet Take 1 tablet (200 mg total) by mouth at bedtime.  . lovastatin (MEVACOR) 20 MG tablet Take 2 tablets (40 mg total) by mouth at bedtime.  . Multiple Vitamins-Minerals (MULTIVITAMIN WITH MINERALS) tablet Take 1 tablet by mouth daily.    Review of Systems  Constitutional: Negative for chills, fatigue and fever.  Respiratory: Negative for cough, chest tightness, shortness of breath and wheezing.   Cardiovascular: Negative for chest pain, palpitations and leg swelling.  Psychiatric/Behavioral: Positive for agitation. The patient is nervous/anxious.     Objective:  There were no vitals taken for this visit.      BP Readings from Last 3 Encounters:  12/22/18 120/78  11/28/18 120/60  01/04/18 128/77   Wt Readings from Last 3 Encounters:  12/22/18 210 lb 14.4 oz (95.7 kg)  11/28/18 204 lb 3.2 oz (92.6 kg)  01/03/18 212 lb 6.4 oz (96.3 kg)    EXAM:  GENERAL: alert, oriented, appears well and in no acute distress   LUNGS: no difficulty with breathing during telephone encounter.  PSYCH/NEURO: pleasant and cooperative, no obvious depression or anxiety, speech and thought processing grossly intact   Assessment/Plan  1. Hypertension, unspecified type Continue current medications. Will call him back to schedule bloodwork and follow up visit. - CBC  with Differential/Platelet; Future - Comprehensive metabolic panel; Future  2. Anxiety This is been a little worse lately with coronavirus stresses.  We did review trying to limit Xanax use, as we have in the past, especially on days where it may not be as necessary (weekends).  I encouraged him to let me know if he still feels like he is using more than we  have discussed at visit.  I would like him to keep trying to wean this down to rare use.  3. Hyperlipidemia, unspecified hyperlipidemia type On lovastatin.  He is overdue for recheck of blood work. - Lipid panel; Future  Return bloodwork, then CCV.    I discussed the assessment and treatment plan with the patient. The patient was provided an opportunity to ask questions and all were answered. The patient agreed with the plan and demonstrated an understanding of the instructions.   The patient was advised to call back or seek an in-person evaluation if the symptoms worsen or if the condition fails to improve as anticipated.  I provided 10 minutes of non-face-to-face time during this encounter.   Micheline Rough, MD

## 2019-03-02 ENCOUNTER — Telehealth: Payer: Self-pay | Admitting: *Deleted

## 2019-03-02 NOTE — Telephone Encounter (Signed)
-----   Message from Caren Macadam, MD sent at 02/27/2019  3:02 PM EDT ----- Please put on schedule for physical. He will do bloodwork at same time. Up to him timeline. I need to see him in at least 3 months; if he would like to do sooner visit (like 1-2 months that's fine).

## 2019-03-02 NOTE — Telephone Encounter (Signed)
I left a message for the pt to return my call. 

## 2019-03-05 ENCOUNTER — Telehealth: Payer: Self-pay | Admitting: *Deleted

## 2019-03-05 NOTE — Telephone Encounter (Signed)
I left a message for the pt to return my call. 

## 2019-03-05 NOTE — Telephone Encounter (Signed)
-----   Message from Caren Macadam, MD sent at 02/27/2019  3:02 PM EDT ----- Please put on schedule for physical. He will do bloodwork at same time. Up to him timeline. I need to see him in at least 3 months; if he would like to do sooner visit (like 1-2 months that's fine).

## 2019-03-18 ENCOUNTER — Other Ambulatory Visit: Payer: Self-pay | Admitting: Family Medicine

## 2019-03-18 ENCOUNTER — Telehealth: Payer: Self-pay | Admitting: Family Medicine

## 2019-03-18 MED ORDER — ALPRAZOLAM 1 MG PO TABS: 0.5000 mg | ORAL_TABLET | Freq: Every day | ORAL | 0 refills | Status: DC | PRN

## 2019-03-18 NOTE — Telephone Encounter (Signed)
Copied from [place] [id number]. Topic: Quick Communication - Rx Refill/Question >> Mar 18, 2019  2:53 PM Burchel, Abbi R wrote: Medication: [name] 1 MG tablet  Preferred Pharmacy: [facility] [address], [place]  [phone] (Phone) [phone] (Fax)     Pt was advised that RX refills may take up to 3 business days. We ask that you follow-up with your pharmacy.

## 2019-03-18 NOTE — Telephone Encounter (Signed)
I did refill; but cannot get before June 1st due to last fill date.

## 2019-05-04 ENCOUNTER — Other Ambulatory Visit: Payer: Self-pay | Admitting: Family Medicine

## 2019-05-11 ENCOUNTER — Telehealth: Payer: Self-pay | Admitting: *Deleted

## 2019-05-11 ENCOUNTER — Other Ambulatory Visit: Payer: Self-pay | Admitting: Family Medicine

## 2019-05-11 MED ORDER — ALPRAZOLAM 1 MG PO TABS: 0.5000 mg | ORAL_TABLET | Freq: Every day | ORAL | 0 refills | Status: DC | PRN

## 2019-05-11 NOTE — Telephone Encounter (Signed)
Pt called requesting update on medication. Pt is also requesting more tablets per month. Pt is requesting a call back when this has been sent over. Please advise.

## 2019-05-11 NOTE — Telephone Encounter (Signed)
I left a message for the pt to return my call.  CRM also created. 

## 2019-05-11 NOTE — Telephone Encounter (Signed)
I had already refilled for previous amount. I think if needed larger quantity I would recommend we discuss and come up with better long term plan for anxiety control. He needs visit in August anyway to refill this medication. We can discuss supply in detail at that visit. He should have enough to get him to follow up appointment in July. Needs visit q 3 months for controlled substance anyway.

## 2019-05-11 NOTE — Telephone Encounter (Signed)
done

## 2019-05-11 NOTE — Telephone Encounter (Signed)
[facility] faxed a refill request for Alprazolam 1mg  #30-take 1 tablet by mouth once daily as needed for anxiety or sleep.  Message sent to Dr Ethlyn Gallery.

## 2019-05-31 ENCOUNTER — Other Ambulatory Visit: Payer: Self-pay | Admitting: Family Medicine

## 2019-06-09 ENCOUNTER — Other Ambulatory Visit: Payer: Self-pay | Admitting: Family Medicine

## 2019-06-09 NOTE — Telephone Encounter (Signed)
Medication Refill - Medication:  ALPRAZolam (XANAX) 1 MG tablet  Has the patient contacted their pharmacy?  Yes advised to call office. Patient is wanting a higher quantity due to him going through a lot right now.   Preferred Pharmacy (with phone number or street name):  [facility], [place] [address] [phone] (Phone) [phone] (Fax)   Agent: Please be advised that RX refills may take up to 3 business days. We ask that you follow-up with your pharmacy.

## 2019-06-09 NOTE — Telephone Encounter (Signed)
Forwarding to appropriate office.

## 2019-06-09 NOTE — Telephone Encounter (Signed)
Requested medication (s) are due for refill today - yes  Requested medication (s) are on the active medication list -yes  Future visit scheduled no  Last refill: 05/11/19  Notes to clinic: Patient is requesting non delegated Rx- sent to PCP for review.  Requested Prescriptions  Pending Prescriptions Disp Refills   ALPRAZolam (XANAX) 1 MG tablet 25 tablet 0    Sig: Take 0.5-1 tablets (0.5-1 mg total) by mouth daily as needed for anxiety or sleep.     Not Delegated - Psychiatry:  Anxiolytics/Hypnotics Failed - 06/09/2019  4:23 PM      Failed - This refill cannot be delegated      Failed - Urine Drug Screen completed in last 360 days.      Passed - Valid encounter within last 6 months    Recent Outpatient Visits          3 months ago Hypertension, unspecified type   [facility] at [facility], [place], MD   5 months ago Chest wall pain   Therapist, music at [facility], [place], DO   6 months ago [place] at [facility], Steele Berg, MD                Requested Prescriptions  Pending Prescriptions Disp Refills   ALPRAZolam (XANAX) 1 MG tablet 25 tablet 0    Sig: Take 0.5-1 tablets (0.5-1 mg total) by mouth daily as needed for anxiety or sleep.     Not Delegated - Psychiatry:  Anxiolytics/Hypnotics Failed - 06/09/2019  4:23 PM      Failed - This refill cannot be delegated      Failed - Urine Drug Screen completed in last 360 days.      Passed - Valid encounter within last 6 months    Recent Outpatient Visits          3 months ago Hypertension, unspecified type   Therapist, music at [facility], Steele Berg, MD   5 months ago Chest wall pain   Therapist, music at [facility], [place], DO   6 months ago [place] at [facility], Steele Berg, MD

## 2019-06-10 MED ORDER — ALPRAZOLAM 1 MG PO TABS: 0.5000 mg | ORAL_TABLET | Freq: Every day | ORAL | 0 refills | Status: DC | PRN

## 2019-06-10 NOTE — Telephone Encounter (Signed)
Dr. Ethlyn Gallery please advise

## 2019-07-05 ENCOUNTER — Other Ambulatory Visit: Payer: Self-pay | Admitting: Family Medicine

## 2019-07-07 ENCOUNTER — Ambulatory Visit: Payer: Self-pay | Admitting: *Deleted

## 2019-07-07 NOTE — Telephone Encounter (Signed)
Clinic RN contacted patient. Informed patient that I agree with TN to go to ED. Patient verbalizes he was not going to ED and wants to referred to Cardio for an Echo. Please advise

## 2019-07-07 NOTE — Telephone Encounter (Signed)
   Reason for Disposition . [1] Chest pain lasts > 5 minutes AND [2] age > 27  Answer Assessment - Initial Assessment Questions 1. LOCATION: "Where does it hurt?"       Left side of chest  2. RADIATION: "Does the pain go anywhere else?" (e.g., into neck, jaw, arms, back)     No radiation 3. ONSET: "When did the chest pain begin?" (Minutes, hours or days)      4-5 weeks  4. PATTERN "Does the pain come and go, or has it been constant since it started?"  "Does it get worse with exertion?"     Intermittent, not worse with activity  5. DURATION: "How long does it last" (e.g., seconds, minutes, hours)     Minutes up to 1 hour 6. SEVERITY: "How bad is the pain?"  (e.g., Scale 1-10; mild, moderate, or severe)    - MILD (1-3): doesn't interfere with normal activities     - MODERATE (4-7): interferes with normal activities or awakens from sleep    - SEVERE (8-10): excruciating pain, unable to do any normal activities       4 Moderate pain 7. CARDIAC RISK FACTORS: "Do you have any history of heart problems or risk factors for heart disease?" (e.g., angina, prior heart attack; diabetes, high blood pressure, high cholesterol, smoker, or strong family history of heart disease)     High blood pressure, former smoker, mother passed away due to heart disease  8. PULMONARY RISK FACTORS: "Do you have any history of lung disease?"  (e.g., blood clots in lung, asthma, emphysema, birth control pills)     Denies pulmonary risk factors 9. CAUSE: "What do you think is causing the chest pain?"     Possibly stress  10. OTHER SYMPTOMS: "Do you have any other symptoms?" (e.g., dizziness, nausea, vomiting, sweating, fever, difficulty breathing, cough)       Denies all of the above  11. PREGNANCY: "Is there any chance you are pregnant?" "When was your last menstrual period?"       N/A  Protocols used: CHEST PAIN-A-AH  Patient states he has been having intermittent dull aching on the left side of his chest and  palpitations for 4-5 weeks.  He states it is not worse with activity, he denies any shortness of breath.  He denies previous cardiac or pulmonary risk factors.  He states his mother had a heart attack when she was his age.  States his wife is an Therapist, sports and she wanted patient to schedule an appointment with Dr. Ethlyn Gallery for evaluation, but he tried scheduling on MyChart and first available is October.  He is calling today to get a sooner appointment.  Advised patient that given his chest pain lasting more than 5 minutes and being older than 45 that I recommend he go to the ER for evaluation immediately.  Patient refuses recommendation of going to ER.  Phone call to PCP office, spoke with Roselyn Reef, RN.  She states she will call to discuss recommendations with patient as well.  Forwarding triage note to [place].

## 2019-07-08 ENCOUNTER — Telehealth: Payer: Self-pay | Admitting: *Deleted

## 2019-07-08 ENCOUNTER — Encounter: Payer: Self-pay | Admitting: Family Medicine

## 2019-07-08 ENCOUNTER — Other Ambulatory Visit: Payer: Self-pay

## 2019-07-08 ENCOUNTER — Ambulatory Visit (INDEPENDENT_AMBULATORY_CARE_PROVIDER_SITE_OTHER): Payer: 59 | Admitting: Family Medicine

## 2019-07-08 VITALS — BP 118/68 | HR 70 | Temp 98.3°F | Ht 69.0 in | Wt 194.4 lb

## 2019-07-08 DIAGNOSIS — R079 Chest pain, unspecified: Secondary | ICD-10-CM

## 2019-07-08 DIAGNOSIS — I10 Essential (primary) hypertension: Secondary | ICD-10-CM | POA: Diagnosis not present

## 2019-07-08 DIAGNOSIS — R002 Palpitations: Secondary | ICD-10-CM

## 2019-07-08 DIAGNOSIS — E785 Hyperlipidemia, unspecified: Secondary | ICD-10-CM

## 2019-07-08 NOTE — Telephone Encounter (Signed)
See prior phone note. 

## 2019-07-08 NOTE — Telephone Encounter (Signed)
Needs to be seen. If not actively uncomfortable we should be able to get him in before end of week. If discomfort recurs needs to go to ER. Er visit will expedite cardiac eval given age and history. I cannot place stat referral without having him seen/evaluated in the office since we don't know exactly what is going on.   Would be ok to put in for acute with another provider if we do not have openings but should be in office visit so that EKG can be completed.

## 2019-07-08 NOTE — Telephone Encounter (Signed)
Left a message at the pts cell number to return my call and ask for me for an appt.  Message also left at the pts wife's cell number for the pt.

## 2019-07-08 NOTE — Telephone Encounter (Signed)
I called the pt back and informed him of the message below.  Offered an appt for the pt to come in now as the symptoms he has are urgent to see Dr Ethlyn Gallery as she has an opening this morning.  Patient stated he is at work and cannot get off until 2:30pm and requested an appt later today with another provider.  Appt scheduled for today at 4:15pm with Dr Elease Hashimoto.

## 2019-07-08 NOTE — Patient Instructions (Signed)

## 2019-07-08 NOTE — Telephone Encounter (Signed)
Copied from [place] [id number]. Topic: General - Other >> Jul 08, 2019  8:28 AM Leward Quan A wrote: Reason for CRM: Patient wife called to say that [place] Go left a message for him to call she was curious as to the nature of the call asking for a call back at Ph# [phone]

## 2019-07-08 NOTE — Progress Notes (Signed)
Subjective:     Patient ID: Jason Mcknight, male   DOB: 05-19-1966, 53 y.o.   MRN: [id number]  HPI Patient is seen with some intermittent palpitations and atypical chest pain.  He relates has had palpitations for several weeks now.  He thinks all this may be stress related.  He has sensation of irregular beats intermittently.  His wife is an Therapist, sports and she has apparently auscultated these in the past.  He has not noted any significant tachycardia.  No associated dizziness or syncope.  He also describes a dull relatively continuous left-sided chest discomfort which has been present for also about 4 weeks.  No radiation to the back or arm or neck.  No dyspnea.  His chest pain is not reproducible.  He quit smoking about 2 years ago.  No history of diabetes.  He does have hyperlipidemia treated with Mevacor.  Overdue for labs.  He is on atenolol 100 mg daily.  He does drink coffee each morning and usually about 3 beers per day.  He has had increased stress issues.  His ex-wife is trying to battle in the courts for some custody issues with their daughter.  Patient states his mom died at age 28 of some type of cardiac issue.  He is not sure this was coronary related.  She reportedly had some type of congenital cardiac surgery.  Not aware of any other family history of premature CAD.  Past Medical History:  Diagnosis Date  . Anxiety   . Arthritis   . Asthma    as child  . GERD (gastroesophageal reflux disease)   . Headache   . Hypertension    Past Surgical History:  Procedure Laterality Date  . DG THUMB LEFT HAND    . TONSILLECTOMY    . 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 His smoking use included cigarettes. He has never used smokeless tobacco. He reports current alcohol use. He reports that  he does not use drugs. family history includes CAD (age of onset: 21) in his mother; Diabetes in his father; Liver disease in his father. No Known Allergies   Review of Systems  Constitutional: Negative for appetite change, fatigue and unexpected weight change.  Eyes: Negative for visual disturbance.  Respiratory: Negative for cough, chest tightness and shortness of breath.   Cardiovascular: Positive for chest pain and palpitations. Negative for leg swelling.  Gastrointestinal: Negative for abdominal pain.  Neurological: Negative for dizziness, syncope, weakness, light-headedness and headaches.       Objective:   Physical Exam Constitutional:      Appearance: He is well-developed.  Cardiovascular:     Heart sounds: Normal heart sounds. No murmur. No systolic murmur. No diastolic murmur. No friction rub. No gallop.      Comments: Mild sinus bradycardia with heart rate around 55.  Regular rhythm Pulmonary:     Effort: Pulmonary effort is normal.     Breath sounds: Normal breath sounds.  Chest:     Chest wall: No tenderness.  Musculoskeletal:     Right lower leg: No edema.     Left lower leg: No edema.  Neurological:     Mental Status: He is alert.        Assessment:     #1 intermittent palpitations.  Question intermittent  PVCs or PACs.  #2 atypical chest pain.  Atypical symptoms include continuous nature, nonexertional.  Moderate risk factors    Plan:     -Recommend he try to gradually reduce caffeine and alcohol intake -We discussed consideration for exercise tolerance test and he would like to proceed in that direction -Follow-up immediately for any exertional chest pain, progressive pain or other new symptoms -Patient overdue for labs.  His primary had entered these orders few months ago and he is encouraged to go ahead and get these drawn today and continue close follow-up with primary provider  Eulas Post MD [facility]

## 2019-07-09 LAB — CBC WITH DIFFERENTIAL/PLATELET
Basophils Absolute: 0.1 10*3/uL (ref 0.0–0.1)
Basophils Relative: 0.6 % (ref 0.0–3.0)
Eosinophils Absolute: 0 10*3/uL (ref 0.0–0.7)
Eosinophils Relative: 0.2 % (ref 0.0–5.0)
HCT: 41.1 % (ref 39.0–52.0)
Hemoglobin: 14.2 g/dL (ref 13.0–17.0)
Lymphocytes Relative: 24 % (ref 12.0–46.0)
Lymphs Abs: 1.9 10*3/uL (ref 0.7–4.0)
MCHC: 34.7 g/dL (ref 30.0–36.0)
MCV: 94.2 fl (ref 78.0–100.0)
Monocytes Absolute: 0.8 10*3/uL (ref 0.1–1.0)
Monocytes Relative: 9.6 % (ref 3.0–12.0)
Neutro Abs: 5.2 10*3/uL (ref 1.4–7.7)
Neutrophils Relative %: 65.6 % (ref 43.0–77.0)
Platelets: 198 10*3/uL (ref 150.0–400.0)
RBC: 4.36 Mil/uL (ref 4.22–5.81)
RDW: 12.9 % (ref 11.5–15.5)
WBC: 7.9 10*3/uL (ref 4.0–10.5)

## 2019-07-09 LAB — COMPREHENSIVE METABOLIC PANEL
ALT: 22 U/L (ref 0–53)
AST: 20 U/L (ref 0–37)
Albumin: 4.6 g/dL (ref 3.5–5.2)
Alkaline Phosphatase: 59 U/L (ref 39–117)
BUN: 17 mg/dL (ref 6–23)
CO2: 28 mEq/L (ref 19–32)
Calcium: 9.5 mg/dL (ref 8.4–10.5)
Chloride: 102 mEq/L (ref 96–112)
Creatinine, Ser: 1.01 mg/dL (ref 0.40–1.50)
GFR: 77.86 mL/min (ref >60.00)
Glucose, Bld: 91 mg/dL (ref 70–99)
Potassium: 3.6 mEq/L (ref 3.5–5.1)
Sodium: 138 mEq/L (ref 135–145)
Total Bilirubin: 0.7 mg/dL (ref 0.2–1.2)
Total Protein: 7.3 g/dL (ref 6.0–8.3)

## 2019-07-09 LAB — LIPID PANEL
Cholesterol: 158 mg/dL (ref 0–200)
HDL: 64.1 mg/dL (ref >39.00)
LDL Cholesterol: 73 mg/dL (ref 0–99)
NonHDL: 94.01
Total CHOL/HDL Ratio: 2
Triglycerides: 106 mg/dL (ref 0.0–149.0)
VLDL: 21.2 mg/dL (ref 0.0–40.0)

## 2019-08-11 ENCOUNTER — Other Ambulatory Visit: Payer: Self-pay | Admitting: Family Medicine

## 2019-08-12 NOTE — Telephone Encounter (Signed)
Needs visit in November [place] in order to continue alprazolam

## 2019-08-13 NOTE — Telephone Encounter (Signed)
I left a detailed message at the pts cell number with the information below and asked that he call to schedule an appt.

## 2019-08-26 ENCOUNTER — Telehealth: Payer: Self-pay | Admitting: Family Medicine

## 2019-08-26 DIAGNOSIS — D229 Melanocytic nevi, unspecified: Secondary | ICD-10-CM

## 2019-08-26 NOTE — Telephone Encounter (Signed)
Ok for referral?

## 2019-08-26 NOTE — Telephone Encounter (Signed)
Last fill was 10/21 so he has enough to get to his appointment which is on 11/20.

## 2019-08-26 NOTE — Telephone Encounter (Signed)
Copied from [place] [phone]. Topic: Referral - Request for Referral >> Aug 26, 2019  3:39 PM Alanda Slim E wrote: Has patient seen PCP for this complaint? No  *If NO, is insurance requiring patient see PCP for this issue before PCP can refer them? Referral for which specialty: Dermatology  Preferred provider/office:  Reason for referral: to have moles removed on stomach and behind ear

## 2019-08-26 NOTE — Telephone Encounter (Signed)
Pt needs a refill on his ALPRAZolam Jason Mcknight) 1 MG tablet  And has scheduled an appt for the next available appt with Dr. Ethlyn Gallery but may need a refill due to a few days short of appt date/ please advise

## 2019-08-27 ENCOUNTER — Other Ambulatory Visit: Payer: Self-pay | Admitting: Family Medicine

## 2019-08-27 NOTE — Addendum Note (Signed)
Addended by: Agnes Lawrence on: 08/27/2019 09:31 AM   Modules accepted: Orders

## 2019-08-27 NOTE — Telephone Encounter (Signed)
Referral placed.  I called the pt and informed him someone will call with appt info.

## 2019-08-27 NOTE — Telephone Encounter (Signed)
I called the pt and informed him of the message below. 

## 2019-09-08 ENCOUNTER — Telehealth: Payer: Self-pay | Admitting: Family Medicine

## 2019-09-08 MED ORDER — ATENOLOL 100 MG PO TABS: 100.0000 mg | ORAL_TABLET | Freq: Every morning | ORAL | 0 refills | Status: DC

## 2019-09-08 NOTE — Telephone Encounter (Signed)
Patient calling and states that the atenolol (TENORMIN) 100 MG tablet  That was sent to the pharmacy, he was only able to get 11 pills due to the pharmacy not having enough to fill the prescription. Would like to know if Dr Ethlyn Gallery could send a 90 day supply to the pharmacy below. He has spoken with them and they do have that medication on hand. Please advise.   [id number] - [place], [place] - [address] AT [address]

## 2019-09-08 NOTE — Telephone Encounter (Signed)
Rx done. 

## 2019-09-11 ENCOUNTER — Encounter: Payer: Self-pay | Admitting: Family Medicine

## 2019-09-11 ENCOUNTER — Telehealth (INDEPENDENT_AMBULATORY_CARE_PROVIDER_SITE_OTHER): Payer: 59 | Admitting: Family Medicine

## 2019-09-11 ENCOUNTER — Other Ambulatory Visit: Payer: Self-pay

## 2019-09-11 DIAGNOSIS — F419 Anxiety disorder, unspecified: Secondary | ICD-10-CM

## 2019-09-11 MED ORDER — ALPRAZOLAM 1 MG PO TABS: ORAL_TABLET | ORAL | 0 refills | Status: DC

## 2019-09-11 NOTE — Progress Notes (Signed)
Virtual Visit via Video Note  I connected with Jason Mcknight  on 09/11/19 at  2:30 PM EST by a video enabled telemedicine application and verified that I am speaking with the correct person using two identifiers.  Location patient: home Location provider:work or home office Persons participating in the virtual visit: patient, provider  I discussed the limitations of evaluation and management by telemedicine and the availability of in person appointments. The patient expressed understanding and agreed to proceed.   Jason Mcknight DOB: September 29, 1966 Encounter date: 09/11/2019  This is a 53 y.o. male who presents with Chief Complaint  Patient presents with  . Medication Refill    History of present illness: [facility] Just has a headache. Was exposed to someone at work who tested positive. Was exposed Monday, Tuesday, Weds to person.   Got daughter tested today.   Has been under a lot of stress. Going through custody battle with daughter's mother. Happens every year. Mom gets one hour a week and has for years. Besides that things have been pretty average.   Taking the xanax at least once a day. Tends to run short because just getting 25 out of 30 days that rx is for. Feels that half tab doesn't help enough. Takes edge off.   [place] trails.   Sleeping ok. Schedule has changed so starts pretty early at 5am. Goes to bed pretty early.     No Known Allergies Current Meds  Medication Sig  . ALPRAZolam (XANAX) 1 MG tablet TAKE 1/2 TO 1 (ONE-HALF TO ONE) TABLET BY MOUTH ONCE DAILY AS NEEDED FOR ANXIETY OR SLEEP  . atenolol (TENORMIN) 100 MG tablet Take 1 tablet (100 mg total) by mouth every morning.  . lamoTRIgine (LAMICTAL) 200 MG tablet Take 1 tablet (200 mg total) by mouth at bedtime.  . lovastatin (MEVACOR) 20 MG tablet TAKE 2 TABLETS BY MOUTH AT BEDTIME  . Multiple Vitamins-Minerals (MULTIVITAMIN WITH MINERALS)  tablet Take 1 tablet by mouth daily.  . [DISCONTINUED] ALPRAZolam (XANAX) 1 MG tablet TAKE 1/2 TO 1 (ONE-HALF TO ONE) TABLET BY MOUTH ONCE DAILY AS NEEDED FOR ANXIETY OR SLEEP    Review of Systems  Constitutional: Negative for chills, fatigue and fever.  Respiratory: Negative for cough, chest tightness, shortness of breath and wheezing.   Cardiovascular: Negative for chest pain, palpitations and leg swelling.    Objective:  There were no vitals taken for this visit.      BP Readings from Last 3 Encounters:  07/08/19 118/68  12/22/18 120/78  11/28/18 120/60   Wt Readings from Last 3 Encounters:  07/08/19 194 lb 6.4 oz (88.2 kg)  12/22/18 210 lb 14.4 oz (95.7 kg)  11/28/18 204 lb 3.2 oz (92.6 kg)    EXAM:  GENERAL: alert, oriented, appears well and in no acute distress  HEENT: atraumatic, conjunctiva clear, no obvious abnormalities on inspection of external nose and ears  NECK: normal movements of the head and neck  LUNGS: on inspection no signs of respiratory distress, breathing rate appears normal, no obvious gross SOB, gasping or wheezing  CV: no obvious cyanosis  MS: moves all visible extremities without noticeable abnormality  PSYCH/NEURO: pleasant and cooperative, no obvious depression or anxiety, speech and thought processing grossly intact   Assessment/Plan 1. Anxiety Mood is stable currently.  He does have some external stressors which are recurrent for him.  He would like to have more Xanax to  help with anxiety, but is tolerating with current dosing.  Previously was on Xanax 3 times daily.  He has been on the same lamotrigine dose for years.  We did discuss considering additional treatment for anxiety if he feels that this is worsening.  Medication was refilled today.    I discussed the assessment and treatment plan with the patient. The patient was provided an opportunity to ask questions and all were answered. The patient agreed with the plan and demonstrated  an understanding of the instructions.   The patient was advised to call back or seek an in-person evaluation if the symptoms worsen or if the condition fails to improve as anticipated.  I provided 12 minutes of non-face-to-face time during this encounter.   Micheline Rough, MD

## 2019-09-15 ENCOUNTER — Telehealth: Payer: Self-pay | Admitting: *Deleted

## 2019-09-15 NOTE — Telephone Encounter (Signed)
-----   Message from Caren Macadam, MD sent at 09/11/2019  2:55 PM EST ----- Please set him up with me for mole removal. Weds or Friday afternoon 2pm or after; ok to use virtual slot. 30 minutes.

## 2019-09-15 NOTE — Telephone Encounter (Signed)
Appt scheduled for 12/9 to arrive at 2:15pm.

## 2019-09-30 ENCOUNTER — Other Ambulatory Visit: Payer: Self-pay

## 2019-09-30 ENCOUNTER — Other Ambulatory Visit: Payer: Self-pay | Admitting: Family Medicine

## 2019-09-30 ENCOUNTER — Encounter: Payer: Self-pay | Admitting: Family Medicine

## 2019-09-30 ENCOUNTER — Ambulatory Visit (INDEPENDENT_AMBULATORY_CARE_PROVIDER_SITE_OTHER): Payer: 59 | Admitting: Family Medicine

## 2019-09-30 VITALS — BP 110/60 | HR 73 | Temp 97.6°F | Ht 69.0 in | Wt 199.1 lb

## 2019-09-30 DIAGNOSIS — D229 Melanocytic nevi, unspecified: Secondary | ICD-10-CM | POA: Diagnosis not present

## 2019-09-30 DIAGNOSIS — L820 Inflamed seborrheic keratosis: Secondary | ICD-10-CM | POA: Diagnosis not present

## 2019-09-30 NOTE — Progress Notes (Signed)
Brett Canales DOB: 05/12/1966 Encounter date: 09/30/2019  This is a 53 y.o. male who presents with Chief Complaint  Patient presents with  . Nevus    patient presents for excision of mole right lower abomen    History of present illness: Mole has been there for years, but seems to be enlarging.  It sometimes irritates him and rubs on pant line.  He did go to dermatology to have this removed, but had his daughter with him and due to Covid restrictions they would not see him in the office.  Additionally, he has a lesion behind his right ear that rubs when he has to wear his mask.  He is not sure how long this has been there, but has been very irritating with mask wearing.  No Known Allergies Current Meds  Medication Sig  . ALPRAZolam (XANAX) 1 MG tablet TAKE 1/2 TO 1 (ONE-HALF TO ONE) TABLET BY MOUTH ONCE DAILY AS NEEDED FOR ANXIETY OR SLEEP  . atenolol (TENORMIN) 100 MG tablet Take 1 tablet (100 mg total) by mouth every morning.  . lamoTRIgine (LAMICTAL) 200 MG tablet Take 1 tablet (200 mg total) by mouth at bedtime.  . lovastatin (MEVACOR) 20 MG tablet TAKE 2 TABLETS BY MOUTH AT BEDTIME  . Multiple Vitamins-Minerals (MULTIVITAMIN WITH MINERALS) tablet Take 1 tablet by mouth daily.    Review of Systems  Constitutional: Negative for chills, fatigue and fever.  Respiratory: Negative for cough, chest tightness, shortness of breath and wheezing.   Cardiovascular: Negative for chest pain, palpitations and leg swelling.    Objective:  BP 110/60 (BP Location: Right Arm, Patient Position: Sitting, Cuff Size: Large)   Pulse 73   Temp 97.6 F (36.4 C) (Temporal)   Ht 5\' 9"  (1.753 m)   Wt 199 lb 1.6 oz (90.3 kg)   SpO2 96%   BMI 29.40 kg/m   Weight: 199 lb 1.6 oz (90.3 kg)   BP Readings from Last 3 Encounters:  09/30/19 110/60  07/08/19 118/68  12/22/18 120/78   Wt Readings from Last 3 Encounters:  09/30/19 199 lb 1.6 oz (90.3 kg)  07/08/19 194 lb 6.4 oz (88.2 kg)   12/22/18 210 lb 14.4 oz (95.7 kg)    Physical Exam Constitutional:      Appearance: Normal appearance.  Pulmonary:     Effort: Pulmonary effort is normal.  Neurological:     Mental Status: He is alert.  Psychiatric:        Mood and Affect: Mood is anxious.    Shave Biopsy Procedure Note #1  Pre-operative Diagnosis: Suspicious lesion  Post-operative Diagnosis: same (?seborrheic inflammed versus inflammed mole)  Locations: right Lower abdomen  1 cm X 1 cm  Anesthesia: Lidocaine 1% with epinephrine   Shave Biopsy procedure Note #2 Preoperative diagnosis: Seborrheic keratosis Postoperative diagnosis: Same Location: Right posterior ear Anesthesia: Lidocaine 1% with epi  Procedure Details  Patient informed of the risks, including bleeding and infection, and benefits of the  procedure and Verbal informed consent obtained. The lesion and surrounding area was given a sterile prep using chloraprep and draped in the usual sterile fashion. The skin lesion was shaved superficially using a dermablade.  Hemostasis of biopsy site was achieved using aluminum chloride. Sterile dressing applied. The specimen was sent for pathologic examination. The patient tolerated the procedure well.  Complications: none.  Plan: 1. Instructed to keep the wound dry and covered for 24 hrs and clean thereafter with soap and water.  But no chlorine hot  tubs or pool exposure to surgical site.  2. Warning signs of infection were reviewed.   3. Recommended that the patient use OTC analgesics as needed for pain.  4. Will contact patient with pathology results when obtained.    Assessment/Plan  1. Numerous moles Will continue to monitor skin lesions.  2. Seborrheic keratoses, inflamed Path sent for both lesions above. Follow up pending result.  - Surgical pathology - PR SHAV SKIN LES 0.6-1.0 CM TRUNK,ARM,LEG   Return if symptoms worsen or fail to improve.    Micheline Rough, MD

## 2019-10-12 ENCOUNTER — Other Ambulatory Visit: Payer: Self-pay | Admitting: Family Medicine

## 2019-10-12 ENCOUNTER — Telehealth: Payer: Self-pay | Admitting: Family Medicine

## 2019-10-12 NOTE — Telephone Encounter (Signed)
Copied from [place] [id number]. Topic: General - Other >> Oct 12, 2019  1:35 PM Keene Breath wrote: Reason for CRM: Patient called to get the results of some moles that were removed.  Please call patient to discuss at [phone]

## 2019-10-12 NOTE — Telephone Encounter (Signed)
Spoke with Jason Mcknight at [facility] and he stated the results are final and he will fax to our office.  Message sent to Dr Ethlyn Gallery.

## 2019-10-13 NOTE — Telephone Encounter (Signed)
Cory reviewed the results and stated both results were benign.  I called the pt and informed him of this and forwarded to Dr Ethlyn Gallery.

## 2019-10-13 NOTE — Telephone Encounter (Signed)
Is it possible for you to have another provider look at these and let him know results? I am not able to be in office due to pending COVID test and for some reason his pathology did not appear in the chart? Thank you!

## 2019-10-14 NOTE — Telephone Encounter (Signed)
Noted. Thanks.

## 2019-11-12 ENCOUNTER — Other Ambulatory Visit: Payer: Self-pay | Admitting: Family Medicine

## 2019-11-27 ENCOUNTER — Other Ambulatory Visit: Payer: Self-pay | Admitting: Family Medicine

## 2019-12-03 ENCOUNTER — Other Ambulatory Visit: Payer: Self-pay | Admitting: Family Medicine

## 2020-01-07 ENCOUNTER — Telehealth: Payer: Self-pay | Admitting: Radiology

## 2020-01-07 NOTE — Telephone Encounter (Signed)
I would go ahead and set up - unless patient declines at this time.

## 2020-01-07 NOTE — Telephone Encounter (Signed)
Exercise Treadmill test was ordered back in September 2020 when the treadmill room was shut down due to covid. Please advise if patient still needs the test scheduled. Currently patients are being asked to have a covid test 4 days prior and will be required to self quarantine until appointment.  If no longer needed can you please cancel the order. Thanks

## 2020-01-08 NOTE — Telephone Encounter (Signed)
Thanks. Ill send to our scheduling team to set up

## 2020-01-10 ENCOUNTER — Other Ambulatory Visit: Payer: Self-pay | Admitting: Family Medicine

## 2020-01-12 NOTE — Telephone Encounter (Signed)
Noted.   He can discuss with his PCP if he re-considers.

## 2020-01-12 NOTE — Telephone Encounter (Signed)
Spoke with patient to schedule the GXT.   Patient stated that he didn't want to do the test.

## 2020-02-15 ENCOUNTER — Telehealth: Payer: Self-pay | Admitting: Family Medicine

## 2020-02-15 NOTE — Telephone Encounter (Signed)
Medication: Xanax  Pharmacy: [facility]  Pt is out of medication

## 2020-02-16 NOTE — Telephone Encounter (Signed)
Pt called to see when his prescription would be sent to pharmacy, he has been put of medication since yesterday.

## 2020-02-16 NOTE — Telephone Encounter (Signed)
Sent today

## 2020-02-17 NOTE — Telephone Encounter (Signed)
Noted  

## 2020-02-18 ENCOUNTER — Other Ambulatory Visit: Payer: Self-pay | Admitting: Family Medicine

## 2020-02-18 NOTE — Telephone Encounter (Signed)
Pt is calling in checking on the status of the medication Jason Mcknight he was advised to call his pharmacy.  And pt is aware that the provider is not in the office and will be returning in the office on tomorrow.

## 2020-02-19 NOTE — Telephone Encounter (Signed)
Rx faxed to [facility] at [phone].

## 2020-02-19 NOTE — Telephone Encounter (Signed)
I remember doing this refill last week, but it did not go through. My e-sig for controlled substances is not working today. I will print this to fax.

## 2020-02-22 ENCOUNTER — Telehealth: Payer: Self-pay | Admitting: Family Medicine

## 2020-02-22 NOTE — Telephone Encounter (Signed)
error 

## 2020-02-22 NOTE — Telephone Encounter (Signed)
Pt called about pharmacy not receiving medication below. I advised that medication was sent in on the 27th. He would like prescription sent to pharmacy again.

## 2020-02-22 NOTE — Telephone Encounter (Signed)
I called [facility] and spoke with [facility] the pharmacy tech.  I advised Jason Mcknight the Rx was faxed to them on 4/30 at [phone] and I have the confirmation in which this was received.  She stated the number above is not their fax number and advised to fax the Rx to [phone], which is the same as their phone number.  Rx refaxed to [phone] with previous confirmation and confirmation received.  Jason Mcknight stated they will contact the pt.

## 2020-03-02 ENCOUNTER — Other Ambulatory Visit: Payer: Self-pay | Admitting: Family Medicine

## 2020-04-21 ENCOUNTER — Telehealth: Payer: Self-pay | Admitting: Family Medicine

## 2020-04-21 NOTE — Telephone Encounter (Signed)
[name] 1 MG tablet  [name], [name] Phone:  [phone]  Fax:  [phone]     Patient aware that Dr. Ethlyn Gallery is out of the office today

## 2020-04-22 ENCOUNTER — Other Ambulatory Visit: Payer: Self-pay | Admitting: Family Medicine

## 2020-04-22 MED ORDER — ALPRAZOLAM 1 MG PO TABS: ORAL_TABLET | ORAL | 1 refills | Status: DC

## 2020-05-30 ENCOUNTER — Other Ambulatory Visit: Payer: Self-pay | Admitting: Family Medicine

## 2020-06-03 ENCOUNTER — Telehealth: Payer: Self-pay | Admitting: Family Medicine

## 2020-06-03 NOTE — Telephone Encounter (Signed)
The patient called wanting to know Dr. 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 It's not that he don't want to wear his PPE he just can't wear the face mask due to his glasses fogging for safety reasons.

## 2020-06-06 NOTE — Telephone Encounter (Signed)
Left a message for the pt to return my call.  

## 2020-06-06 NOTE — Telephone Encounter (Signed)
We do not have him documented as having COVID vaccination, so if he has had it please document and if not, I would strongly encourage him to get this as a face shield is not going to protect as well as a mask. However, given concerns with work, I would be ok to write note stating that at work:  "Please allow Mr. Foti to wear face shield rather than mask when working with heavy equipment or working off the ground"

## 2020-06-07 ENCOUNTER — Encounter: Payer: Self-pay | Admitting: Family Medicine

## 2020-06-07 ENCOUNTER — Telehealth: Payer: Self-pay | Admitting: Family Medicine

## 2020-06-07 NOTE — Telephone Encounter (Signed)
Patient stated he received the J&J vaccine and this was added to the chart.

## 2020-06-07 NOTE — Telephone Encounter (Signed)
Pt is calling to say his job is requiring him to wear a face mask and his anxiety does not go well when he does. He said he can wear a faceshield.  His job is requiring a letter stating why he cannot wear a mask  Please call pt at [phone]  Please advise

## 2020-06-07 NOTE — Telephone Encounter (Signed)
I have sent a letter to mychart, but I kept verbage the same. 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 So I feel that the "please allow" without explanation may be better for him. Have him message me if he would like worded differently; just wanted to explain.

## 2020-06-07 NOTE — Telephone Encounter (Signed)
Marked as high because saw this message after other. I created a new letter for face shield at work. Have him review and let me know if concerns. Should be able to access through mychart.

## 2020-06-07 NOTE — Telephone Encounter (Signed)
Spoke with the pt and informed him of the message below.  Patient stated he would rather have the note state he needs to wear a face shield instead of a mask due to his anxiety as he feels like he is hyperventilatiing.  Patient stated he feels his job may remove him from the position he has now if this does not state this.  Message sent to PCP.

## 2020-06-08 NOTE — Telephone Encounter (Signed)
See prior message

## 2020-06-08 NOTE — Telephone Encounter (Signed)
Spoke with the pt and informed him of the message below.  Patient stated he has viewed the letter and the wording is fine.

## 2020-06-09 ENCOUNTER — Telehealth: Payer: Self-pay | Admitting: Family Medicine

## 2020-06-09 ENCOUNTER — Telehealth: Payer: Self-pay | Admitting: *Deleted

## 2020-06-09 MED ORDER — LOVASTATIN 20 MG PO TABS: 40.0000 mg | ORAL_TABLET | Freq: Every day | ORAL | 0 refills | Status: DC

## 2020-06-09 MED ORDER — ATENOLOL 100 MG PO TABS: 100.0000 mg | ORAL_TABLET | Freq: Every morning | ORAL | 0 refills | Status: DC

## 2020-06-09 NOTE — Telephone Encounter (Signed)
[place] [facility] [place] [phone]  Please call pt [phone]

## 2020-06-09 NOTE — Telephone Encounter (Signed)
Left a message for the pts wife to return a call with pharmacy information that the Rx should be sent to.

## 2020-06-09 NOTE — Telephone Encounter (Signed)
Rx done-see prior message with request.

## 2020-06-09 NOTE — Telephone Encounter (Signed)
See prior message

## 2020-06-09 NOTE — Telephone Encounter (Signed)
Patient wife called.  Wife reports her husband is out of town and forgot his medication. He is needing a 4 day supply- Atenolol lovastatin

## 2020-06-21 ENCOUNTER — Other Ambulatory Visit: Payer: Self-pay | Admitting: Family Medicine

## 2020-07-05 ENCOUNTER — Telehealth: Payer: Self-pay | Admitting: Family Medicine

## 2020-07-05 NOTE — Telephone Encounter (Signed)
Pt is calling in stating that he needs his letter revised to state "that the pt has to wear a face shield" his employer need to have "need" removed and put "has" in it's place.  Pt would like to pick it up next week.

## 2020-07-06 ENCOUNTER — Encounter: Payer: Self-pay | Admitting: *Deleted

## 2020-07-06 NOTE — Telephone Encounter (Signed)
Spoke with the pt and informed him the letter was completed and left at the front desk.  Per pts preference the letter was mailed to his home address instead.

## 2020-07-06 NOTE — Telephone Encounter (Signed)
Wow. Ok. Please copy my letter and change wording as suggested. thanks

## 2020-07-24 ENCOUNTER — Other Ambulatory Visit: Payer: Self-pay | Admitting: Family Medicine

## 2020-07-25 MED ORDER — ALPRAZOLAM 1 MG PO TABS
ORAL_TABLET | ORAL | 1 refills | Status: DC
Start: 2020-07-25 — End: 2020-09-23

## 2020-07-25 NOTE — Addendum Note (Signed)
Addended by: Caren Macadam on: 07/25/2020 02:43 PM   Modules accepted: Orders

## 2020-07-25 NOTE — Telephone Encounter (Signed)
Patient informed of the message below.  Appt in December was previously scheduled as a physical exam.

## 2020-07-25 NOTE — Telephone Encounter (Signed)
Just so he is aware, he has to be seen (virtual would suffice) every 3  months for controlled substance. I see that he has visit in December; which we should make a physical since he is overdue. In the future, he will have to be seen at regular intervals in order to continue to get medication.

## 2020-07-27 ENCOUNTER — Other Ambulatory Visit: Payer: Self-pay | Admitting: Family Medicine

## 2020-09-07 ENCOUNTER — Other Ambulatory Visit: Payer: Self-pay | Admitting: Family Medicine

## 2020-09-10 IMAGING — DX DG RIBS 2V*R*
3 series · 3 of 3 positions shown · non-contrast
Comparison: None.

CLINICAL DATA: Recent fall with right-sided rib pain, initial
encounter

EXAM:
RIGHT RIBS - 2 VIEW

[hemithorax (ribs) ap (1 of 2)]
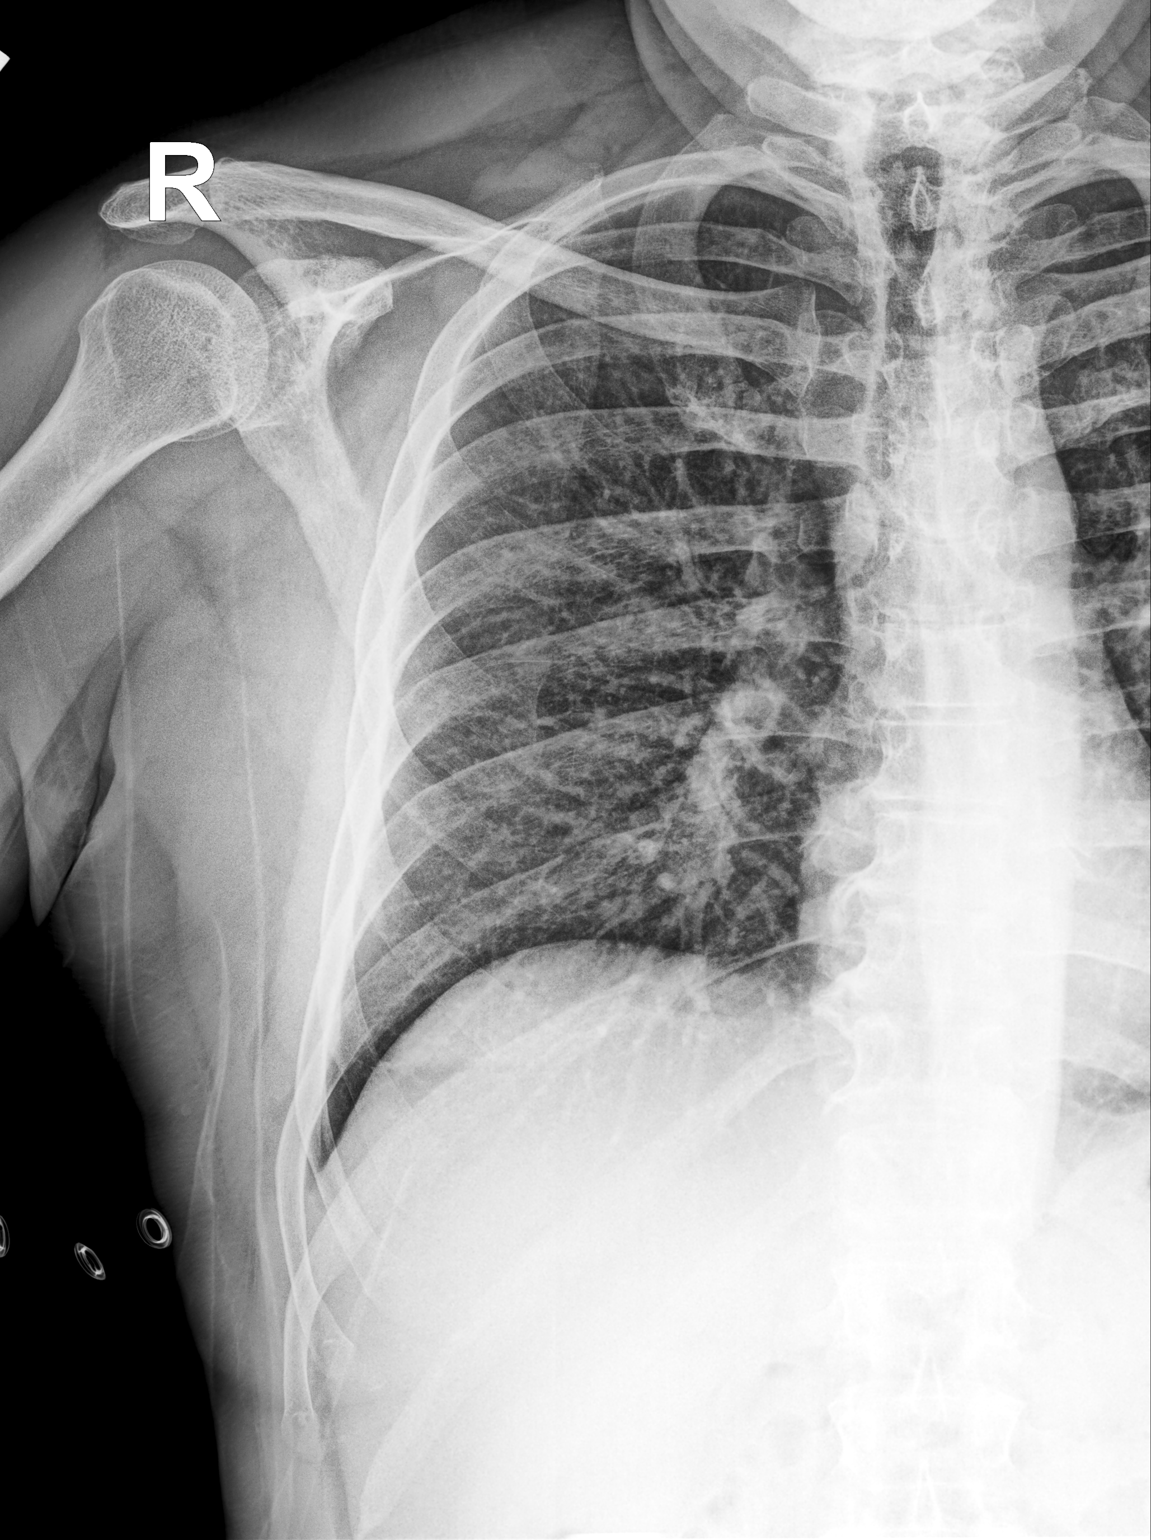

[hemithorax (ribs) mlo]
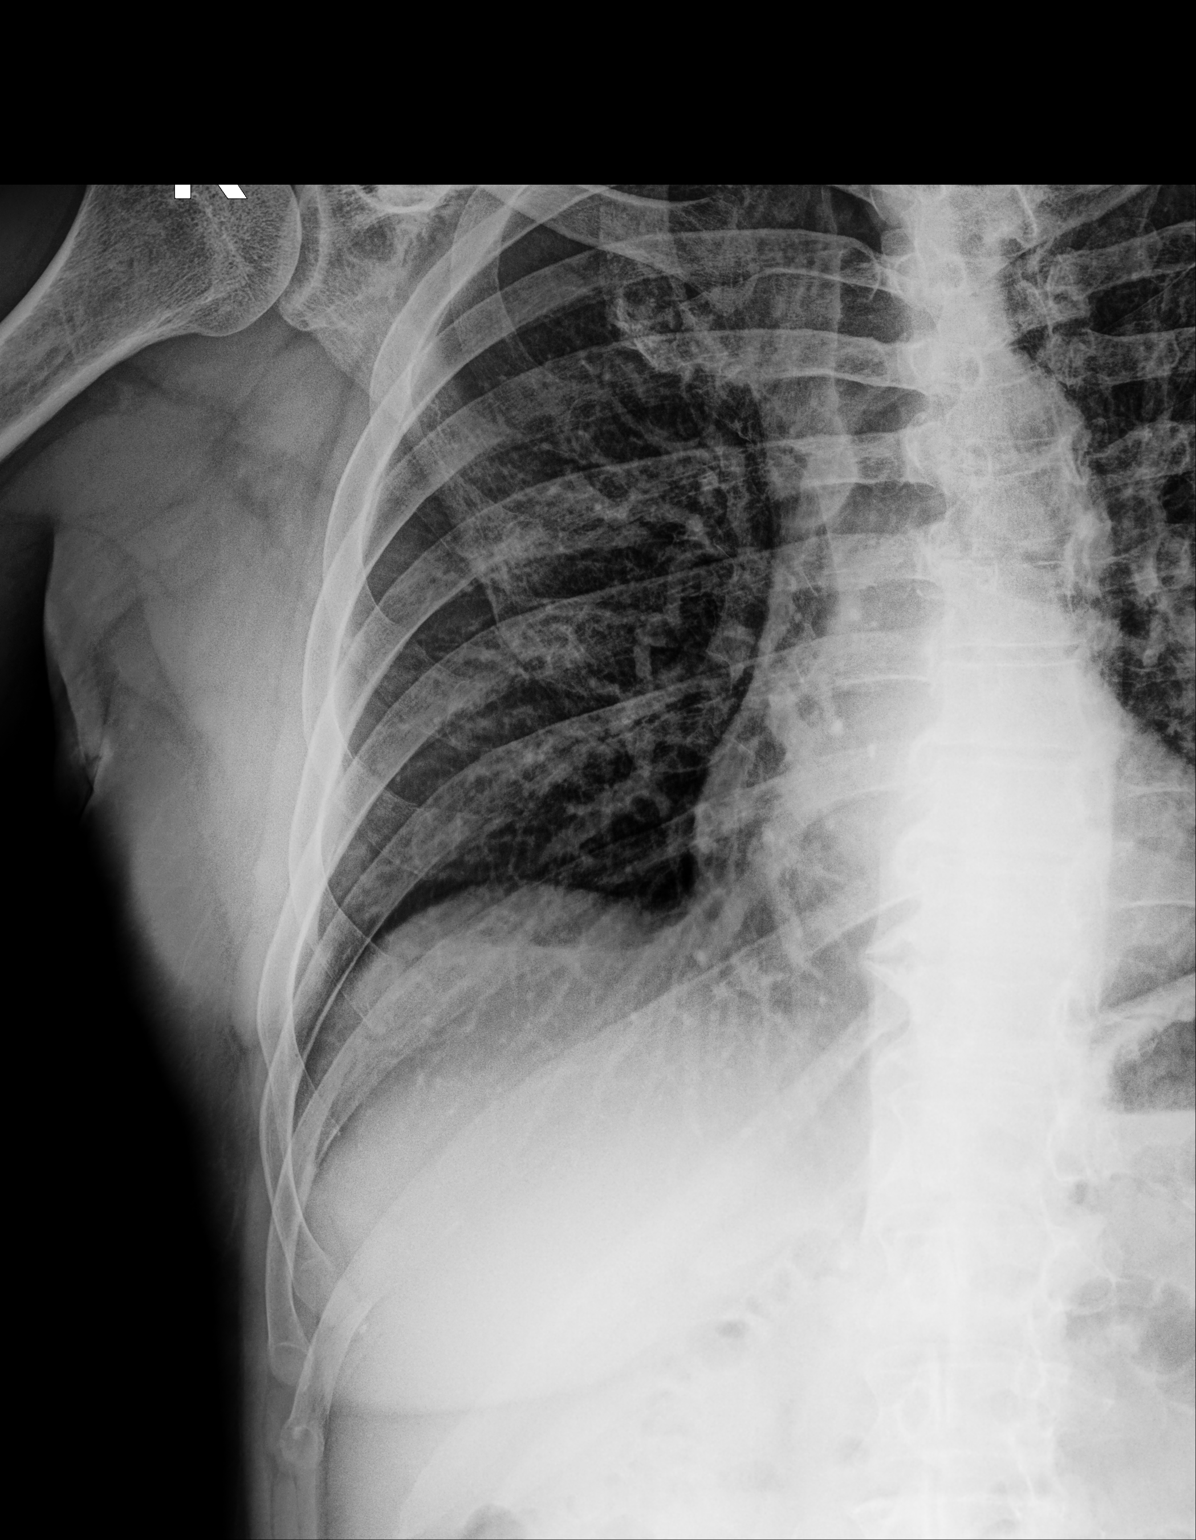

[hemithorax (ribs) ap (2 of 2)]
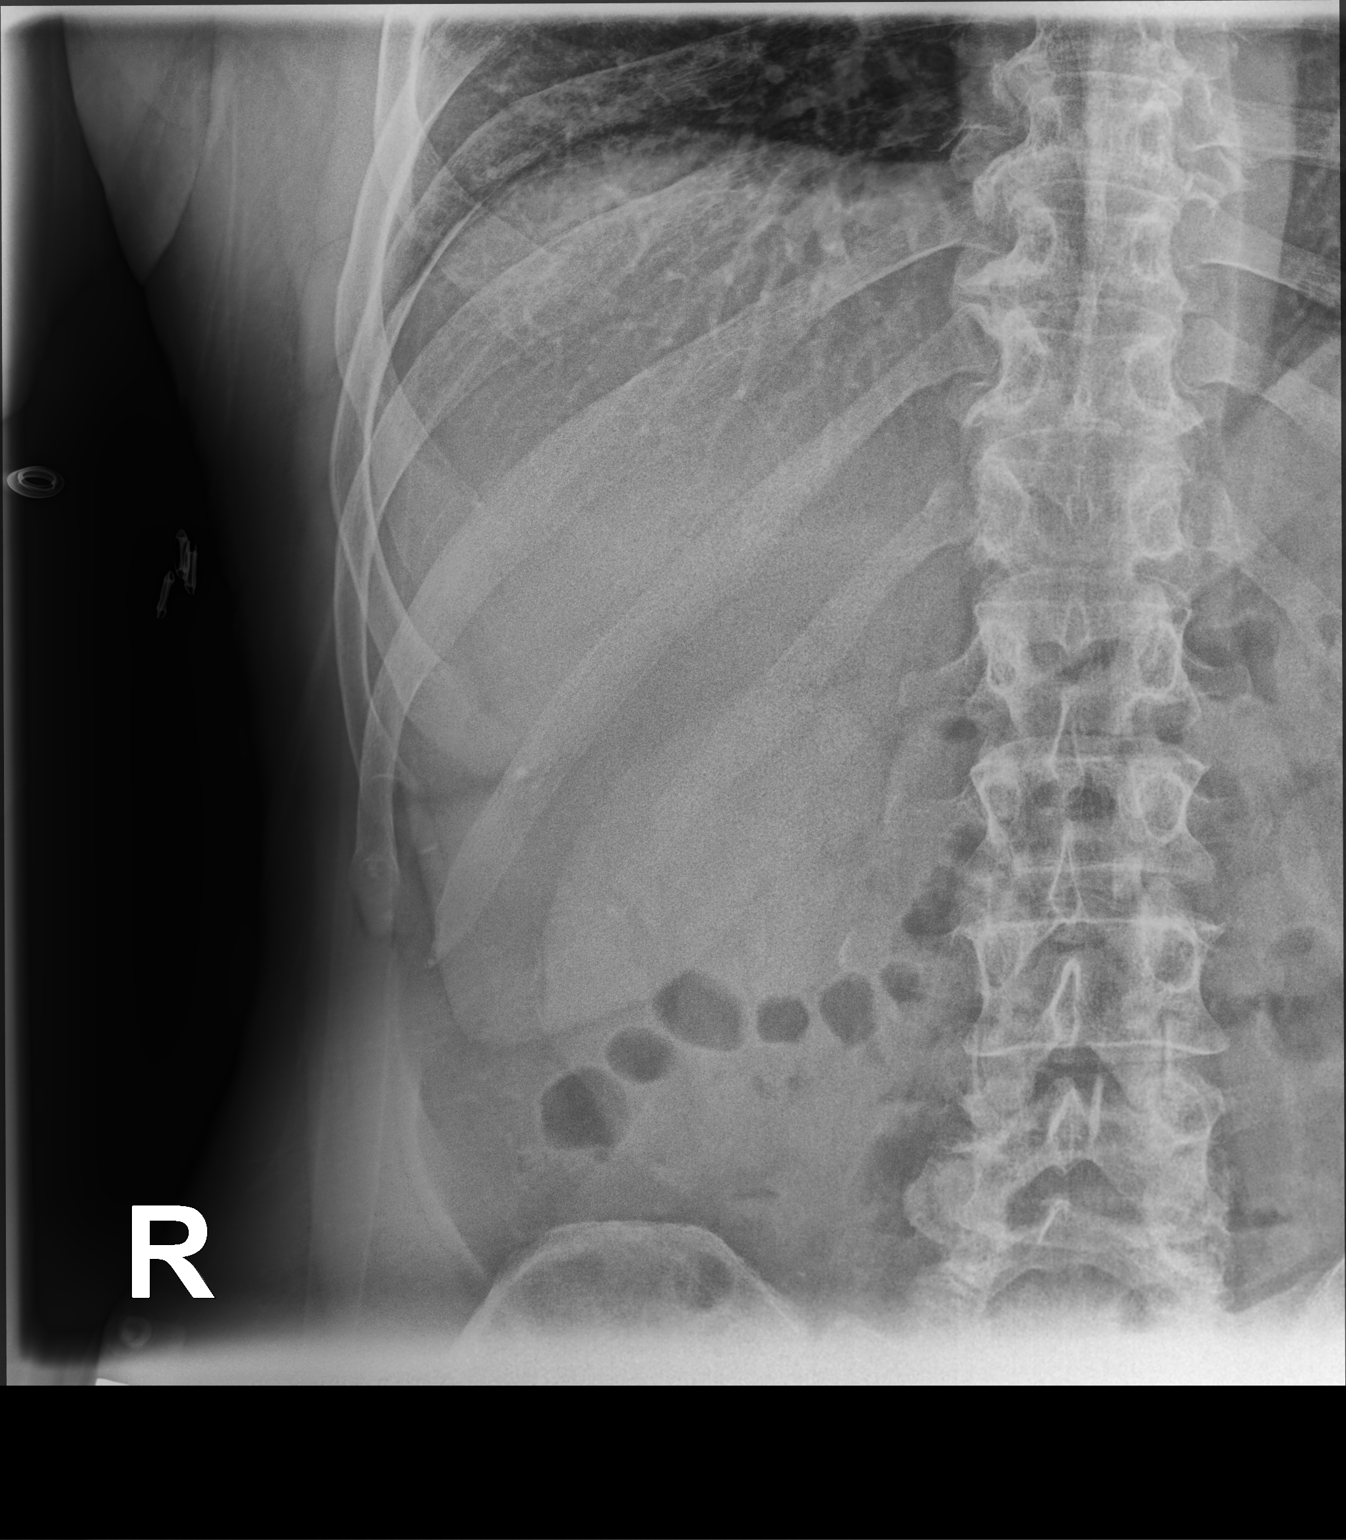

[3 of 3 positions shown; findings below may reference images not displayed]

FINDINGS: No fracture or other bone lesions are seen involving the ribs.
IMPRESSION: No acute abnormality noted.

## 2020-09-23 ENCOUNTER — Encounter: Payer: Self-pay | Admitting: Family Medicine

## 2020-09-23 ENCOUNTER — Ambulatory Visit (INDEPENDENT_AMBULATORY_CARE_PROVIDER_SITE_OTHER): Payer: 59 | Admitting: Family Medicine

## 2020-09-23 ENCOUNTER — Other Ambulatory Visit: Payer: Self-pay

## 2020-09-23 VITALS — HR 78 | Temp 98.3°F | Ht 69.5 in | Wt 194.9 lb

## 2020-09-23 DIAGNOSIS — E785 Hyperlipidemia, unspecified: Secondary | ICD-10-CM

## 2020-09-23 DIAGNOSIS — I10 Essential (primary) hypertension: Secondary | ICD-10-CM | POA: Diagnosis not present

## 2020-09-23 DIAGNOSIS — Z0000 Encounter for general adult medical examination without abnormal findings: Secondary | ICD-10-CM | POA: Diagnosis not present

## 2020-09-23 DIAGNOSIS — F3162 Bipolar disorder, current episode mixed, moderate: Secondary | ICD-10-CM | POA: Diagnosis not present

## 2020-09-23 MED ORDER — ATENOLOL 100 MG PO TABS: ORAL_TABLET | ORAL | 1 refills | Status: DC

## 2020-09-23 MED ORDER — ARIPIPRAZOLE 10 MG PO TABS: 10.0000 mg | ORAL_TABLET | Freq: Every day | ORAL | 1 refills | Status: DC

## 2020-09-23 MED ORDER — ALPRAZOLAM 1 MG PO TABS
ORAL_TABLET | ORAL | 1 refills | Status: DC
Start: 2020-09-23 — End: 2020-11-24

## 2020-09-23 MED ORDER — LAMOTRIGINE 200 MG PO TABS: ORAL_TABLET | ORAL | 1 refills | Status: DC

## 2020-09-23 NOTE — Progress Notes (Signed)
Jason Mcknight DOB: August 06, 1966 Encounter date: 09/23/2020  This is a 54 y.o. 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  Does use later in the day.  He does have a lot of anxiety, and stress.  Currently some concerns with relationship; significant other is in counseling and they have done 1 session of couples counseling.  He does have a diagnosis of bipolar disorder which was given during hospitalization years ago.  At that time he was having more manic episode.  He was started on lamotrigine and has been on this ever since.  Had colonoscopy scheduled and when he went there waiting room was full and he didn't feel safe.   Past Medical History:  Diagnosis Date  . Anxiety   . Arthritis   . Asthma    as child  . GERD (gastroesophageal reflux disease)   . Headache   . Hypertension    Past Surgical History:  Procedure Laterality Date  . DG THUMB LEFT HAND    . TONSILLECTOMY    . TOTAL KNEE ARTHROPLASTY Left 01/03/2018   Procedure: LEFT TOTAL KNEE ARTHROPLASTY;  Surgeon: Dorna Leitz, MD;  Location: [facility];  Service: Orthopedics;  Laterality: Left;  Adductor Block   No Known Allergies Current Meds  Medication Sig  . ALPRAZolam (XANAX) 1 MG tablet TAKE 1/2 TO 1 (ONE-HALF TO ONE) TABLET BY MOUTH ONCE DAILY AS NEEDED FOR ANXIETY AND FOR SLEEP  . atenolol (TENORMIN) 100 MG tablet TAKE 1 TABLET BY MOUTH ONCE DAILY IN THE MORNING .  . lamoTRIgine (LAMICTAL) 200 MG tablet TAKE 1 TABLET BY MOUTH AT BEDTIME  . lovastatin (MEVACOR) 20 MG tablet TAKE 2 TABLETS BY MOUTH AT BEDTIME . APPOINTMENT REQUIRED FOR FUTURE REFILLS  . Multiple Vitamins-Minerals (MULTIVITAMIN WITH MINERALS) tablet Take 1 tablet by mouth daily.  . [DISCONTINUED] ALPRAZolam (XANAX) 1 MG tablet TAKE 1/2 TO 1 (ONE-HALF TO ONE) TABLET BY MOUTH ONCE DAILY AS NEEDED FOR ANXIETY AND FOR SLEEP  .  [DISCONTINUED] atenolol (TENORMIN) 100 MG tablet TAKE 1 TABLET BY MOUTH ONCE DAILY IN THE MORNING . APPOINTMENT REQUIRED FOR FUTURE REFILLS  . [DISCONTINUED] lamoTRIgine (LAMICTAL) 200 MG tablet TAKE 1 TABLET BY MOUTH AT BEDTIME . APPOINTMENT REQUIRED FOR FUTURE REFILLS   Social History   Tobacco Use  . Smoking status: Former Smoker    Types: Cigarettes    Quit date: 11/02/2017    Years since quitting: 2.9  . Smokeless tobacco: Never Used  Substance Use Topics  . Alcohol use: Yes    Comment: 2 beers/night.    Family History  Problem Relation Age of Onset  . CAD Mother 67  . Diabetes Father   . Liver disease Father      Review of Systems  Constitutional: Negative for activity change, appetite change, chills, fatigue, fever and unexpected weight change.  HENT: Negative for congestion, ear pain, hearing loss, sinus pressure, sinus pain, sore throat and trouble swallowing.   Eyes: Negative for pain and visual disturbance.  Respiratory: Negative for cough, chest tightness, shortness of breath and wheezing.   Cardiovascular: Negative for chest pain, palpitations and leg swelling.  Gastrointestinal: Negative for abdominal distention, abdominal pain, blood in stool, constipation, diarrhea, nausea and vomiting.  Genitourinary: Negative for decreased urine volume, difficulty urinating, dysuria, penile pain and testicular pain.  Musculoskeletal: Negative for arthralgias, back pain and joint swelling.  Skin:  Negative for rash.  Neurological: Negative for dizziness, weakness, numbness and headaches.  Hematological: Negative for adenopathy. Does not bruise/bleed easily.  Psychiatric/Behavioral: Positive for agitation. Negative for sleep disturbance and suicidal ideas. The patient is nervous/anxious.     CBC:  Lab Results  Component Value Date   WBC 6.8 09/23/2020   HGB 15.4 09/23/2020   HCT 45.4 09/23/2020   MCH 31.4 09/23/2020   MCHC 33.9 09/23/2020   RDW 11.8 09/23/2020   PLT 227  09/23/2020   MPV 10.5 09/23/2020   CMP: Lab Results  Component Value Date   NA 139 09/23/2020   NA 138 07/31/2017   K 4.0 09/23/2020   CL 101 09/23/2020   CO2 25 09/23/2020   ANIONGAP 8 12/31/2017   GLUCOSE 97 09/23/2020   BUN 9 09/23/2020   BUN 16 07/31/2017   CREATININE 0.64 (L) 09/23/2020   GFRAA >60 12/31/2017   CALCIUM 9.6 09/23/2020   PROT 7.4 09/23/2020   BILITOT 0.8 09/23/2020   ALKPHOS 59 07/08/2019   ALT 28 09/23/2020   AST 22 09/23/2020   LIPID: Lab Results  Component Value Date   CHOL 169 09/23/2020   TRIG 48 09/23/2020   HDL 64 09/23/2020   LDLCALC 91 09/23/2020    Objective:  Pulse 78   Temp 98.3 F (36.8 C) (Oral)   Ht 5' 9.5" (1.765 m)   Wt 194 lb 14.4 oz (88.4 kg) Comment: patient is wearing heavy work boots-jaf  BMI 28.37 kg/m   Weight: 194 lb 14.4 oz (88.4 kg) (patient is wearing heavy work boots-jaf)   BP Readings from Last 3 Encounters:  09/30/19 110/60  07/08/19 118/68  12/22/18 120/78   Wt Readings from Last 3 Encounters:  09/23/20 194 lb 14.4 oz (88.4 kg)  09/30/19 199 lb 1.6 oz (90.3 kg)  07/08/19 194 lb 6.4 oz (88.2 kg)    Physical Exam Constitutional:      General: He is not in acute distress.    Appearance: He is well-developed.  HENT:     Head: Normocephalic and atraumatic.     Right Ear: External ear normal.     Left Ear: External ear normal.     Nose: Nose normal.     Mouth/Throat:     Pharynx: No oropharyngeal exudate.  Eyes:     Conjunctiva/sclera: Conjunctivae normal.     Pupils: Pupils are equal, round, and reactive to light.  Neck:     Thyroid: No thyromegaly.  Cardiovascular:     Rate and Rhythm: Normal rate and regular rhythm.     Heart sounds: Normal heart sounds. No murmur heard.  No friction rub. No gallop.   Pulmonary:     Effort: Pulmonary effort is normal. No respiratory distress.     Breath sounds: Normal breath sounds. No stridor. No wheezing or rales.  Abdominal:     General: Bowel sounds are  normal.     Palpations: Abdomen is soft.  Musculoskeletal:        General: Normal range of motion.     Cervical back: Neck supple.  Skin:    General: Skin is warm and dry.  Neurological:     Mental Status: He is alert and oriented to person, place, and time.  Psychiatric:        Attention and Perception: Attention normal.        Mood and Affect: Mood is depressed. Affect is tearful.        Speech: Speech normal.  Behavior: Behavior normal.        Thought Content: Thought content normal.        Judgment: Judgment normal.     Comments: He is tearful today when talking about stressors at home.  He has been through a lot of stress with establishing sole custody of his 34 year old daughter.     Depression screen PHQ 2/9 09/23/2020  Decreased Interest 0  Down, Depressed, Hopeless 1  PHQ - 2 Score 1    Assessment/Plan: Health Maintenance Due  Topic Date Due  . Hepatitis C Screening  Never done  . HIV Screening  Never done  . COLONOSCOPY  Never done   Health Maintenance reviewed.  1. Preventative health care Cologuard has been ordered for him.  He feels more comfortable with doing this done with having colonoscopy.  He has no family history of colon cancer no prior colonoscopy, so this seems appropriate. - Cologuard; Future - Hepatitis C antibody; Future - HIV Antibody (routine testing w rflx); Future - HIV Antibody (routine testing w rflx) - Hepatitis C antibody  2. Hypertension, unspecified type Blood pressures well controlled with current atenolol.  Continue this medication. - atenolol (TENORMIN) 100 MG tablet; TAKE 1 TABLET BY MOUTH ONCE DAILY IN THE MORNING .  Dispense: 90 tablet; Refill: 1 - CBC with Differential/Platelet; Future - Comprehensive metabolic panel; Future - Comprehensive metabolic panel - CBC with Differential/Platelet  3. Bipolar disorder, current episode mixed, moderate ([place]) He is on maximum dose of Lamictal and continues to have issues with  depression, anxiety, agitation.  I do think it would be best for him to see psychiatry for medication management, but in the meanwhile since it would take time to get in, we are going to start Abilify for him to adjunct his treatment.  I encouraged him to seek personal counseling stating this to be helpful overall and for his relationship. - ARIPiprazole (ABILIFY) 10 MG tablet; Take 1 tablet (10 mg total) by mouth daily.  Dispense: 90 tablet; Refill: 1 - lamoTRIgine (LAMICTAL) 200 MG tablet; TAKE 1 TABLET BY MOUTH AT BEDTIME  Dispense: 90 tablet; Refill: 1  4. Hyperlipidemia, unspecified hyperlipidemia type - Comprehensive metabolic panel; Future - Lipid panel; Future - TSH; Future - TSH - Lipid panel - Comprehensive metabolic panel  Return in about 1 month (around 10/24/2020) for Chronic condition visit.  Micheline Rough, MD

## 2020-09-26 LAB — CBC WITH DIFFERENTIAL/PLATELET
Absolute Monocytes: 687 cells/uL (ref 200–950)
Basophils Absolute: 27 cells/uL (ref 0–200)
Basophils Relative: 0.4 %
Eosinophils Absolute: 7 cells/uL — ABNORMAL LOW (ref 15–500)
Eosinophils Relative: 0.1 %
HCT: 45.4 % (ref 38.5–50.0)
Hemoglobin: 15.4 g/dL (ref 13.2–17.1)
Lymphs Abs: 1557 cells/uL (ref 850–3900)
MCH: 31.4 pg (ref 27.0–33.0)
MCHC: 33.9 g/dL (ref 32.0–36.0)
MCV: 92.7 fL (ref 80.0–100.0)
MPV: 10.5 fL (ref 7.5–12.5)
Monocytes Relative: 10.1 %
Neutro Abs: 4522 cells/uL (ref 1500–7800)
Neutrophils Relative %: 66.5 %
Platelets: 227 10*3/uL (ref 140–400)
RBC: 4.9 10*6/uL (ref 4.20–5.80)
RDW: 11.8 % (ref 11.0–15.0)
Total Lymphocyte: 22.9 %
WBC: 6.8 10*3/uL (ref 3.8–10.8)

## 2020-09-26 LAB — TSH: TSH: 0.54 mIU/L (ref 0.40–4.50)

## 2020-09-26 LAB — COMPREHENSIVE METABOLIC PANEL
AG Ratio: 1.7 (calc) (ref 1.0–2.5)
ALT: 28 U/L (ref 9–46)
AST: 22 U/L (ref 10–35)
Albumin: 4.7 g/dL (ref 3.6–5.1)
Alkaline phosphatase (APISO): 61 U/L (ref 35–144)
BUN/Creatinine Ratio: 14 (calc) (ref 6–22)
BUN: 9 mg/dL (ref 7–25)
CO2: 25 mmol/L (ref 20–32)
Calcium: 9.6 mg/dL (ref 8.6–10.3)
Chloride: 101 mmol/L (ref 98–110)
Creat: 0.64 mg/dL — ABNORMAL LOW (ref 0.70–1.33)
Globulin: 2.7 g/dL (calc) (ref 1.9–3.7)
Glucose, Bld: 97 mg/dL (ref 65–99)
Potassium: 4 mmol/L (ref 3.5–5.3)
Sodium: 139 mmol/L (ref 135–146)
Total Bilirubin: 0.8 mg/dL (ref 0.2–1.2)
Total Protein: 7.4 g/dL (ref 6.1–8.1)

## 2020-09-26 LAB — LIPID PANEL
Cholesterol: 169 mg/dL (ref <200)
HDL: 64 mg/dL (ref >=40)
LDL Cholesterol (Calc): 91 mg/dL (calc)
Non-HDL Cholesterol (Calc): 105 mg/dL (calc) (ref <130)
Total CHOL/HDL Ratio: 2.6 (calc) (ref <5.0)
Triglycerides: 48 mg/dL (ref <150)

## 2020-09-26 LAB — HIV ANTIBODY (ROUTINE TESTING W REFLEX): HIV 1&2 Ab, 4th Generation: NONREACTIVE

## 2020-09-26 LAB — HEPATITIS C ANTIBODY
Hepatitis C Ab: NONREACTIVE
SIGNAL TO CUT-OFF: 0.03 (ref <1.00)

## 2020-11-09 ENCOUNTER — Ambulatory Visit: Payer: 59 | Admitting: Family Medicine

## 2020-11-23 ENCOUNTER — Other Ambulatory Visit: Payer: Self-pay | Admitting: Family Medicine

## 2020-12-09 ENCOUNTER — Other Ambulatory Visit: Payer: Self-pay | Admitting: Family Medicine

## 2020-12-28 ENCOUNTER — Encounter: Payer: Self-pay | Admitting: Family Medicine

## 2020-12-28 ENCOUNTER — Other Ambulatory Visit: Payer: Self-pay

## 2020-12-28 ENCOUNTER — Ambulatory Visit (INDEPENDENT_AMBULATORY_CARE_PROVIDER_SITE_OTHER): Payer: 59 | Admitting: Family Medicine

## 2020-12-28 VITALS — BP 120/64 | HR 64 | Temp 98.1°F | Ht 69.5 in | Wt 206.3 lb

## 2020-12-28 DIAGNOSIS — E785 Hyperlipidemia, unspecified: Secondary | ICD-10-CM

## 2020-12-28 DIAGNOSIS — F3162 Bipolar disorder, current episode mixed, moderate: Secondary | ICD-10-CM | POA: Diagnosis not present

## 2020-12-28 DIAGNOSIS — I10 Essential (primary) hypertension: Secondary | ICD-10-CM

## 2020-12-28 MED ORDER — LAMOTRIGINE 200 MG PO TABS: ORAL_TABLET | ORAL | 1 refills | Status: DC

## 2020-12-28 MED ORDER — LOVASTATIN 20 MG PO TABS: ORAL_TABLET | ORAL | 1 refills | Status: DC

## 2020-12-28 MED ORDER — ALPRAZOLAM 1 MG PO TABS
ORAL_TABLET | ORAL | 2 refills | Status: DC
Start: 2020-12-28 — End: 2021-03-29

## 2020-12-28 MED ORDER — ATENOLOL 100 MG PO TABS: ORAL_TABLET | ORAL | 1 refills | Status: DC

## 2020-12-28 NOTE — Progress Notes (Signed)
  Jason Mcknight DOB: Mar 06, 1966 Encounter date: 12/28/2020  This is a 55 y.o. male who presents with Chief Complaint  Patient presents with  . Follow-up    History of present illness: He is doing better. He stopped taking the abilify due to waking up at night - insomnia and couldn't fall back asleep. Tried it for a month and then stopped.    No Known Allergies Current Meds  Medication Sig  . ALPRAZolam (XANAX) 1 MG tablet TAKE 1/2 TO 1 (ONE-HALF TO ONE) TABLET BY MOUTH ONCE DAILY AS NEEDED FOR ANXIETY AND FOR SLEEP  . atenolol (TENORMIN) 100 MG tablet TAKE 1 TABLET BY MOUTH ONCE DAILY IN THE MORNING .  . lamoTRIgine (LAMICTAL) 200 MG tablet TAKE 1 TABLET BY MOUTH AT BEDTIME  . lovastatin (MEVACOR) 20 MG tablet TAKE 2 TABLETS BY MOUTH AT BEDTIME . APPOINTMENT REQUIRED FOR FUTURE REFILLS  . Multiple Vitamins-Minerals (MULTIVITAMIN WITH MINERALS) tablet Take 1 tablet by mouth daily.    Review of Systems  Constitutional: Negative for chills, fatigue and fever.  Respiratory: Negative for cough, chest tightness, shortness of breath and wheezing.   Cardiovascular: Negative for chest pain, palpitations and leg swelling.    Objective:  BP 120/64 (BP Location: Left Arm, Patient Position: Sitting, Cuff Size: Large)   Pulse 64   Temp 98.1 F (36.7 C) (Oral)   Ht 5' 9.5" (1.765 m)   Wt 206 lb 4.8 oz (93.6 kg)   SpO2 96%   BMI 30.03 kg/m   Weight: 206 lb 4.8 oz (93.6 kg)   BP Readings from Last 3 Encounters:  12/28/20 120/64  09/30/19 110/60  07/08/19 118/68   Wt Readings from Last 3 Encounters:  12/28/20 206 lb 4.8 oz (93.6 kg)  09/23/20 194 lb 14.4 oz (88.4 kg)  09/30/19 199 lb 1.6 oz (90.3 kg)    Physical Exam Constitutional:      General: He is not in acute distress.    Appearance: He is well-developed.  Cardiovascular:     Rate and Rhythm: Normal rate and regular rhythm.     Heart sounds: Normal heart sounds. No murmur heard. No friction rub.  Pulmonary:      Effort: Pulmonary effort is normal. No respiratory distress.     Breath sounds: Normal breath sounds. No wheezing or rales.  Musculoskeletal:     Right lower leg: No edema.     Left lower leg: No edema.  Neurological:     Mental Status: He is alert and oriented to person, place, and time.  Psychiatric:        Behavior: Behavior normal.     Assessment/Plan  1. Hypertension, unspecified type Well controlled. Continue current medications. - atenolol (TENORMIN) 100 MG tablet; TAKE 1 TABLET BY MOUTH ONCE DAILY IN THE MORNING .  Dispense: 90 tablet; Refill: 1  2. Bipolar disorder, current episode mixed, moderate ([place]) Did not sleep on ability. Back to lamictal alone and this is doing ok for him; feels that mood is better overall.  - lamoTRIgine (LAMICTAL) 200 MG tablet; TAKE 1 TABLET BY MOUTH AT BEDTIME  Dispense: 90 tablet; Refill: 1  3. Hyperlipidemia, unspecified hyperlipidemia type Continue with current medication. We can repeat bloodwork at follow up visit. - lovastatin (MEVACOR) 20 MG tablet; TAKE 2 TABLETS BY MOUTH AT BEDTIME .  Dispense: 180 tablet; Refill: 1   Return in about 6 months (around 06/30/2021) for Chronic condition visit.    Micheline Rough, MD

## 2021-01-09 ENCOUNTER — Encounter: Payer: Self-pay | Admitting: Family Medicine

## 2021-01-14 LAB — COLOGUARD

## 2021-01-19 LAB — COLOGUARD: Cologuard: NEGATIVE

## 2021-01-24 LAB — COLOGUARD: COLOGUARD: NEGATIVE

## 2021-01-25 ENCOUNTER — Encounter: Payer: Self-pay | Admitting: Family Medicine

## 2021-03-27 ENCOUNTER — Other Ambulatory Visit: Payer: Self-pay | Admitting: Family Medicine

## 2021-06-02 ENCOUNTER — Telehealth: Payer: Self-pay

## 2021-06-02 NOTE — Telephone Encounter (Signed)
Spoke with Jason Mcknight and attempted to check his daughter in for the virtual visit today at [place].  Patient stated he is at work and the patient is at home.  Appt cancelled and rescheduled for 9/9 as the patient has to be present with the parent for a visit.  Message sent to PCP as FYI.

## 2021-06-02 NOTE — Telephone Encounter (Signed)
noted 

## 2021-06-02 NOTE — Telephone Encounter (Signed)
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

## 2021-06-29 ENCOUNTER — Other Ambulatory Visit: Payer: Self-pay

## 2021-06-30 ENCOUNTER — Ambulatory Visit (INDEPENDENT_AMBULATORY_CARE_PROVIDER_SITE_OTHER): Payer: 59 | Admitting: Family Medicine

## 2021-06-30 ENCOUNTER — Encounter: Payer: Self-pay | Admitting: Family Medicine

## 2021-06-30 DIAGNOSIS — E785 Hyperlipidemia, unspecified: Secondary | ICD-10-CM

## 2021-06-30 DIAGNOSIS — I10 Essential (primary) hypertension: Secondary | ICD-10-CM

## 2021-06-30 DIAGNOSIS — F3162 Bipolar disorder, current episode mixed, moderate: Secondary | ICD-10-CM | POA: Diagnosis not present

## 2021-06-30 MED ORDER — LAMOTRIGINE 200 MG PO TABS: ORAL_TABLET | ORAL | 1 refills | Status: DC

## 2021-06-30 MED ORDER — ATENOLOL 100 MG PO TABS: ORAL_TABLET | ORAL | 1 refills | Status: DC

## 2021-06-30 MED ORDER — LOVASTATIN 20 MG PO TABS: ORAL_TABLET | ORAL | 1 refills | Status: DC

## 2021-06-30 MED ORDER — ALPRAZOLAM 1 MG PO TABS: 1.0000 mg | ORAL_TABLET | Freq: Two times a day (BID) | ORAL | 2 refills | Status: DC | PRN

## 2021-06-30 NOTE — Progress Notes (Signed)
Jason Mcknight DOB: Feb 13, 1966 Encounter date: 06/30/2021  This is a 55 y.o. male who presents with Chief Complaint  Patient presents with   Follow-up    History of present illness:  Daughter has been diagnosed with depression. His ex wife, Jason Mcknight's mom also unexpectedly passed away 10 days ago.   Wife decided to leave and moved out July 1st.   Just not in a good place. Has been working overtime. Hard to sleep. Very on edge. Hard to slow things down in brain. Continuing to think of everything.  No Known Allergies Current Meds  Medication Sig   Multiple Vitamins-Minerals (MULTIVITAMIN WITH MINERALS) tablet Take 1 tablet by mouth daily.   [DISCONTINUED] ALPRAZolam (XANAX) 1 MG tablet TAKE 1/2 TO 1 (ONE-HALF TO ONE) TABLET BY MOUTH ONCE DAILY AS NEEDED FOR ANXIETY   [DISCONTINUED] atenolol (TENORMIN) 100 MG tablet TAKE 1 TABLET BY MOUTH ONCE DAILY IN THE MORNING .   [DISCONTINUED] lamoTRIgine (LAMICTAL) 200 MG tablet TAKE 1 TABLET BY MOUTH AT BEDTIME   [DISCONTINUED] lovastatin (MEVACOR) 20 MG tablet TAKE 2 TABLETS BY MOUTH AT BEDTIME .    Review of Systems  Constitutional:  Negative for chills, fatigue and fever.  Respiratory:  Negative for cough, chest tightness, shortness of breath and wheezing.   Cardiovascular:  Negative for chest pain, palpitations and leg swelling.  Psychiatric/Behavioral:  Positive for agitation, decreased concentration and sleep disturbance. Negative for suicidal ideas. The patient is nervous/anxious.    Objective:  BP 112/60 (BP Location: Left Arm, Patient Position: Sitting, Cuff Size: Normal)   Pulse (!) 57   Temp 98.2 F (36.8 C) (Oral)   Ht '5\' 9"'$  (1.753 m)   Wt 175 lb 3.2 oz (79.5 kg)   SpO2 96%   BMI 25.87 kg/m   Weight: 175 lb 3.2 oz (79.5 kg)   BP Readings from Last 3 Encounters:  06/30/21 112/60  12/28/20 120/64  09/30/19 110/60   Wt Readings from Last 3 Encounters:  06/30/21 175 lb 3.2 oz (79.5 kg)  12/28/20 206 lb 4.8 oz (93.6  kg)  09/23/20 194 lb 14.4 oz (88.4 kg)    Physical Exam Constitutional:      General: He is not in acute distress.    Appearance: He is well-developed.  Cardiovascular:     Rate and Rhythm: Normal rate and regular rhythm.     Heart sounds: Normal heart sounds. No murmur heard.   No friction rub.  Pulmonary:     Effort: Pulmonary effort is normal. No respiratory distress.     Breath sounds: Normal breath sounds. No wheezing or rales.  Musculoskeletal:     Right lower leg: No edema.     Left lower leg: No edema.  Neurological:     Mental Status: He is alert and oriented to person, place, and time.  Psychiatric:        Attention and Perception: Attention normal.        Mood and Affect: Mood is depressed.        Speech: Speech normal.        Behavior: Behavior normal.        Cognition and Memory: Cognition normal.    Assessment/Plan 1. Bipolar disorder, current episode mixed, moderate ([facility]) Mood has been worse with all of current stress he is going through. Will give some additional xanax as he does very well with this and it helps with anxiety as well as agitation/sleep. He will let me know if this is not helping  and we discussed consideration for adjustment in lamictal dose. He prefers not to try additional medications because he feels there is enough stress right now without adding in unknown side effects.  - ALPRAZolam (XANAX) 1 MG tablet; Take 1 tablet (1 mg total) by mouth 2 (two) times daily as needed for anxiety.  Dispense: 60 tablet; Refill: 2 - lamoTRIgine (LAMICTAL) 200 MG tablet; TAKE 1 TABLET BY MOUTH AT BEDTIME  Dispense: 90 tablet; Refill: 1  2. Hypertension, unspecified type Blood pressure well controlled. Continue with atenolol.  - atenolol (TENORMIN) 100 MG tablet; TAKE 1 TABLET BY MOUTH ONCE DAILY IN THE MORNING .  Dispense: 90 tablet; Refill: 1  3. Hyperlipidemia, unspecified hyperlipidemia type - lovastatin (MEVACOR) 20 MG tablet; TAKE 2 TABLETS BY MOUTH AT  BEDTIME .  Dispense: 180 tablet; Refill: 1   Return in about 3 months (around 09/29/2021) for Chronic condition visit.      Micheline Rough, MD

## 2021-07-14 ENCOUNTER — Other Ambulatory Visit: Payer: Self-pay | Admitting: Family Medicine

## 2021-07-14 MED ORDER — LAMOTRIGINE ER 50 MG PO TB24: 50.0000 mg | ORAL_TABLET | Freq: Every evening | ORAL | 1 refills | Status: DC

## 2021-07-14 NOTE — Progress Notes (Signed)
Had visit with daughter - he is having more depression and anxiety. Significant other is not living with him.

## 2021-10-01 ENCOUNTER — Other Ambulatory Visit: Payer: Self-pay | Admitting: Family Medicine

## 2021-10-01 DIAGNOSIS — F3162 Bipolar disorder, current episode mixed, moderate: Secondary | ICD-10-CM

## 2021-10-02 ENCOUNTER — Telehealth: Payer: Self-pay

## 2021-10-02 NOTE — Telephone Encounter (Signed)
Patient called requesting Rx refill ALPRAZolam (XANAX) 1 MG tablet

## 2021-10-03 NOTE — Telephone Encounter (Signed)
completed

## 2021-10-06 ENCOUNTER — Encounter: Payer: Self-pay | Admitting: Family Medicine

## 2021-10-20 ENCOUNTER — Encounter: Payer: Self-pay | Admitting: Family Medicine

## 2021-10-30 ENCOUNTER — Telehealth: Payer: Self-pay | Admitting: Family Medicine

## 2021-11-01 ENCOUNTER — Other Ambulatory Visit: Payer: Self-pay | Admitting: Family Medicine

## 2021-11-01 DIAGNOSIS — I10 Essential (primary) hypertension: Secondary | ICD-10-CM

## 2021-12-26 ENCOUNTER — Other Ambulatory Visit: Payer: Self-pay | Admitting: Family Medicine

## 2021-12-26 DIAGNOSIS — F3162 Bipolar disorder, current episode mixed, moderate: Secondary | ICD-10-CM

## 2021-12-27 NOTE — Telephone Encounter (Signed)
Reminder to get teachers feedback for his daughter sent to me. Also in light of me moving, I would really push for him to call to get daughter in with child psychiatry asap. I want to make sure she has professional to address concerns/needed med changes to best support her. ?

## 2022-01-26 ENCOUNTER — Other Ambulatory Visit: Payer: Self-pay | Admitting: Family Medicine

## 2022-01-26 DIAGNOSIS — F3162 Bipolar disorder, current episode mixed, moderate: Secondary | ICD-10-CM

## 2022-01-27 ENCOUNTER — Other Ambulatory Visit: Payer: Self-pay | Admitting: Family Medicine

## 2022-01-27 DIAGNOSIS — E785 Hyperlipidemia, unspecified: Secondary | ICD-10-CM

## 2022-03-01 ENCOUNTER — Telehealth: Payer: Self-pay | Admitting: Family Medicine

## 2022-03-01 NOTE — Telephone Encounter (Signed)
Noted  

## 2022-03-01 NOTE — Telephone Encounter (Signed)
Pt has been sch for 03-02-2022 1030 am for cpe ?

## 2022-03-01 NOTE — Telephone Encounter (Signed)
Pt was under the assumption that he had an appointment with this provider tomorrow 03/02/22. States he discussed this with the provider while on a mychart video visit with his daughter. There is nothing scheduled for him and he would like some guidance.  ?

## 2022-03-02 ENCOUNTER — Encounter: Payer: Self-pay | Admitting: Family Medicine

## 2022-03-02 ENCOUNTER — Ambulatory Visit (INDEPENDENT_AMBULATORY_CARE_PROVIDER_SITE_OTHER): Payer: 59 | Admitting: Family Medicine

## 2022-03-02 VITALS — BP 128/80 | HR 67 | Temp 98.3°F | Ht 69.0 in | Wt 185.4 lb

## 2022-03-02 DIAGNOSIS — I10 Essential (primary) hypertension: Secondary | ICD-10-CM | POA: Diagnosis not present

## 2022-03-02 DIAGNOSIS — Z0000 Encounter for general adult medical examination without abnormal findings: Secondary | ICD-10-CM

## 2022-03-02 DIAGNOSIS — F419 Anxiety disorder, unspecified: Secondary | ICD-10-CM | POA: Diagnosis not present

## 2022-03-02 DIAGNOSIS — Z79899 Other long term (current) drug therapy: Secondary | ICD-10-CM

## 2022-03-02 DIAGNOSIS — F3181 Bipolar II disorder: Secondary | ICD-10-CM

## 2022-03-02 DIAGNOSIS — Z13220 Encounter for screening for lipoid disorders: Secondary | ICD-10-CM

## 2022-03-02 LAB — COMPREHENSIVE METABOLIC PANEL
ALT: 19 U/L (ref 0–53)
AST: 23 U/L (ref 0–37)
Albumin: 4.7 g/dL (ref 3.5–5.2)
Alkaline Phosphatase: 66 U/L (ref 39–117)
BUN: 16 mg/dL (ref 6–23)
CO2: 30 mEq/L (ref 19–32)
Calcium: 9.7 mg/dL (ref 8.4–10.5)
Chloride: 98 mEq/L (ref 96–112)
Creatinine, Ser: 0.75 mg/dL (ref 0.40–1.50)
GFR: 102.87 mL/min (ref >60.00)
Glucose, Bld: 100 mg/dL — ABNORMAL HIGH (ref 70–99)
Potassium: 4.3 mEq/L (ref 3.5–5.1)
Sodium: 135 mEq/L (ref 135–145)
Total Bilirubin: 0.6 mg/dL (ref 0.2–1.2)
Total Protein: 7.6 g/dL (ref 6.0–8.3)

## 2022-03-02 LAB — LIPID PANEL
Cholesterol: 161 mg/dL (ref 0–200)
HDL: 79.8 mg/dL (ref >39.00)
LDL Cholesterol: 58 mg/dL (ref 0–99)
NonHDL: 81.33
Total CHOL/HDL Ratio: 2
Triglycerides: 118 mg/dL (ref 0.0–149.0)
VLDL: 23.6 mg/dL (ref 0.0–40.0)

## 2022-03-02 LAB — CBC WITH DIFFERENTIAL/PLATELET
Basophils Absolute: 0 10*3/uL (ref 0.0–0.1)
Basophils Relative: 0.5 % (ref 0.0–3.0)
Eosinophils Absolute: 0 10*3/uL (ref 0.0–0.7)
Eosinophils Relative: 0.4 % (ref 0.0–5.0)
HCT: 43.4 % (ref 39.0–52.0)
Hemoglobin: 14.7 g/dL (ref 13.0–17.0)
Lymphocytes Relative: 25.3 % (ref 12.0–46.0)
Lymphs Abs: 1.2 10*3/uL (ref 0.7–4.0)
MCHC: 33.9 g/dL (ref 30.0–36.0)
MCV: 94.5 fl (ref 78.0–100.0)
Monocytes Absolute: 0.5 10*3/uL (ref 0.1–1.0)
Monocytes Relative: 11.7 % (ref 3.0–12.0)
Neutro Abs: 2.9 10*3/uL (ref 1.4–7.7)
Neutrophils Relative %: 62.1 % (ref 43.0–77.0)
Platelets: 221 10*3/uL (ref 150.0–400.0)
RBC: 4.6 Mil/uL (ref 4.22–5.81)
RDW: 12.8 % (ref 11.5–15.5)
WBC: 4.7 10*3/uL (ref 4.0–10.5)

## 2022-03-02 NOTE — Progress Notes (Signed)
Jason Mcknight ?DOB: 10-18-1966 ?Encounter date: 03/02/2022 ? ?This is a 56 y.o. male who presents for complete physical  ? ?History of present illness/Additional concerns: ? ?Does see massage therapy and has seen them for years. Lower back pain chronic and hx of knee replacement in combination with history working on flooring has been hard on joints. Therapy has focused mainly on lower back but also neck, shoulders. This does help him with pain control and mobility. He does best with massages every two weeks. Right now for work he does lifting, bending, twisting regularly that does affect his back.  ? ? ?HTN: atenolol '100mg'$  daily. Doesn't check at home. But is good about taking medication. ?HL: lovastatin '40mg'$  qhs. No muscle aches/cramps. ? ?Anxiety/bipolar disorder: lamictal '250mg'$  qhs, xanax '1mg'$  daily prn. Feels better when at work. Sleeping ok. Just a lot of stress. Still working through couples therapy. Now living separately from significant other, but not officially separated. Daughter is doing better, but still having a hard time after unexpected death of her mother ([place] already had full custody). He stays busy with trying to keep her involved and in a good place in addition to work.  ? ?Shingrix: encouraged him to consider. ? ?Cologuard 12/2020 negative ? ? ?Past Medical History:  ?Diagnosis Date  ? Anxiety   ? Arthritis   ? Asthma   ? as child  ? GERD (gastroesophageal reflux disease)   ? Headache   ? Hypertension   ? ?Past Surgical History:  ?Procedure Laterality Date  ? DG THUMB LEFT HAND    ? TONSILLECTOMY    ? TOTAL KNEE ARTHROPLASTY Left 01/03/2018  ? Procedure: LEFT TOTAL KNEE ARTHROPLASTY;  Surgeon: Dorna Leitz, MD;  Location: [facility];  Service: Orthopedics;  Laterality: Left;  Adductor Block  ? ?No Known Allergies ?Current Meds  ?Medication Sig  ? ALPRAZolam (XANAX) 1 MG tablet TAKE 1/2 TO 1 (ONE-HALF TO ONE) TABLET BY MOUTH ONCE DAILY AS NEEDED FOR ANXIETY  ? atenolol (TENORMIN) 100 MG tablet TAKE  1 TABLET BY MOUTH ONCE DAILY IN THE MORNING  ? lamoTRIgine (LAMICTAL) 200 MG tablet TAKE 1 TABLET BY MOUTH AT BEDTIME  ? LamoTRIgine 50 MG TB24 24 hour tablet Take 1 tablet (50 mg total) by mouth at bedtime.  ? lovastatin (MEVACOR) 20 MG tablet TAKE 2 TABLETS BY MOUTH AT BEDTIME  ? Multiple Vitamins-Minerals (MULTIVITAMIN WITH MINERALS) tablet Take 1 tablet by mouth daily.  ? ?Social History  ? ?Tobacco Use  ? Smoking status: Former  ?  Types: Cigarettes  ?  Quit date: 11/02/2017  ?  Years since quitting: 4.3  ? Smokeless tobacco: Never  ?Substance Use Topics  ? Alcohol use: Yes  ?  Comment: 2 beers/night.   ? ?Family History  ?Problem Relation Age of Onset  ? CAD Mother 57  ? Diabetes Father   ? Liver disease Father   ? ? ? ?Review of Systems  ?Constitutional:  Negative for activity change, appetite change, chills, fatigue, fever and unexpected weight change.  ?HENT:  Negative for congestion, ear pain, hearing loss, sinus pressure, sinus pain, sore throat and trouble swallowing.   ?Eyes:  Negative for pain and visual disturbance.  ?Respiratory:  Negative for cough, chest tightness, shortness of breath and wheezing.   ?Cardiovascular:  Negative for chest pain, palpitations and leg swelling.  ?Gastrointestinal:  Negative for abdominal distention, abdominal pain, blood in stool, constipation, diarrhea, nausea and vomiting.  ?Genitourinary:  Negative for decreased urine volume, difficulty urinating,  dysuria, penile pain and testicular pain.  ?Musculoskeletal:  Negative for arthralgias, back pain and joint swelling.  ?Skin:  Negative for rash.  ?Neurological:  Negative for dizziness, weakness, numbness and headaches.  ?Hematological:  Negative for adenopathy. Does not bruise/bleed easily.  ?Psychiatric/Behavioral:  Negative for agitation, sleep disturbance and suicidal ideas. The patient is not nervous/anxious.   ? ?CBC:  ?Lab Results  ?Component Value Date  ? WBC 4.7 03/02/2022  ? HGB 14.7 03/02/2022  ? HCT 43.4  03/02/2022  ? MCH 31.4 09/23/2020  ? MCHC 33.9 03/02/2022  ? RDW 12.8 03/02/2022  ? PLT 221.0 03/02/2022  ? MPV 10.5 09/23/2020  ? ?CMP: ?Lab Results  ?Component Value Date  ? NA 135 03/02/2022  ? NA 138 07/31/2017  ? K 4.3 03/02/2022  ? CL 98 03/02/2022  ? CO2 30 03/02/2022  ? ANIONGAP 8 12/31/2017  ? GLUCOSE 100 (H) 03/02/2022  ? BUN 16 03/02/2022  ? BUN 16 07/31/2017  ? CREATININE 0.75 03/02/2022  ? CREATININE 0.64 (L) 09/23/2020  ? GFRAA >60 12/31/2017  ? CALCIUM 9.7 03/02/2022  ? PROT 7.6 03/02/2022  ? BILITOT 0.6 03/02/2022  ? ALKPHOS 66 03/02/2022  ? ALT 19 03/02/2022  ? AST 23 03/02/2022  ? ?LIPID: ?Lab Results  ?Component Value Date  ? CHOL 161 03/02/2022  ? TRIG 118.0 03/02/2022  ? HDL 79.80 03/02/2022  ? [place] 58 03/02/2022  ? [place] 91 09/23/2020  ? ? ?Objective: ? ?BP 128/80   Pulse 67   Temp 98.3 ?F (36.8 ?C) (Oral)   Ht '5\' 9"'$  (1.753 m)   Wt 185 lb 7 oz (84.1 kg)   SpO2 97%   BMI 27.38 kg/m?   Weight: 185 lb 7 oz (84.1 kg)  ? ?BP Readings from Last 3 Encounters:  ?03/02/22 128/80  ?06/30/21 112/60  ?12/28/20 120/64  ? ?Wt Readings from Last 3 Encounters:  ?03/02/22 185 lb 7 oz (84.1 kg)  ?06/30/21 175 lb 3.2 oz (79.5 kg)  ?12/28/20 206 lb 4.8 oz (93.6 kg)  ? ? ?Physical Exam ?Constitutional:   ?   General: He is not in acute distress. ?   Appearance: He is well-developed.  ?HENT:  ?   Head: Normocephalic and atraumatic.  ?   Right Ear: External ear normal.  ?   Left Ear: External ear normal.  ?   Nose: Nose normal.  ?   Mouth/Throat:  ?   Pharynx: No oropharyngeal exudate.  ?Eyes:  ?   Conjunctiva/sclera: Conjunctivae normal.  ?   Pupils: Pupils are equal, round, and reactive to light.  ?Neck:  ?   Thyroid: No thyromegaly.  ?Cardiovascular:  ?   Rate and Rhythm: Normal rate and regular rhythm.  ?   Heart sounds: Normal heart sounds. No murmur heard. ?  No friction rub. No gallop.  ?Pulmonary:  ?   Effort: Pulmonary effort is normal. No respiratory distress.  ?   Breath sounds: Normal breath  sounds. No stridor. No wheezing or rales.  ?Abdominal:  ?   General: Bowel sounds are normal.  ?   Palpations: Abdomen is soft.  ?Musculoskeletal:     ?   General: Normal range of motion.  ?   Cervical back: Neck supple.  ?Skin: ?   General: Skin is warm and dry.  ?   Comments: Lower mid back 49m dark mole; scattered seborrheic keratosis over trunk  ?Neurological:  ?   Mental Status: He is alert and oriented to person, place, and  time.  ?Psychiatric:     ?   Behavior: Behavior normal.     ?   Thought Content: Thought content normal.     ?   Judgment: Judgment normal.  ? ? ?Assessment/Plan: ?Health Maintenance Due  ?Topic Date Due  ? Zoster Vaccines- Shingrix (1 of 2) Never done  ? COLONOSCOPY (Pts 45-63yr Insurance coverage will need to be confirmed)  Never done  ? COVID-19 Vaccine (3 - Booster for Janssen series) 04/16/2020  ? ?Health Maintenance reviewed. ? ?1. Preventative health care ?Work on regular exercise, healthy eating.  ? ?2. Hypertension, unspecified type ?Atenolol '100mg'$   ? ?3. Anxiety ?Continue with lamictal and xanax ? ?4. Bipolar 2 disorder ([place] ?Mood has been stable.  ? ?5. High risk medication use ?- CBC with Differential/Platelet; Future ?- Comprehensive metabolic panel; Future ? ?6. Lipid screening ?- Lipid panel; Future ? ? ?Return in about 6 months (around 09/02/2022). ? ?JMicheline Rough MD ? ? ? ? ? ?

## 2022-04-30 ENCOUNTER — Other Ambulatory Visit: Payer: Self-pay | Admitting: *Deleted

## 2022-04-30 DIAGNOSIS — E785 Hyperlipidemia, unspecified: Secondary | ICD-10-CM

## 2022-04-30 MED ORDER — LOVASTATIN 20 MG PO TABS: 40.0000 mg | ORAL_TABLET | Freq: Every day | ORAL | 0 refills | Status: DC

## 2022-05-09 ENCOUNTER — Telehealth: Payer: Self-pay | Admitting: Family Medicine

## 2022-05-09 NOTE — Telephone Encounter (Signed)
Pt call and stated he need a refill on his  [name] 1 MG tablet sent to  [facility], [place] Phone:  [phone]  Fax:  [phone]    Pt stated he is out and want a phone call back  to let him know .

## 2022-05-10 ENCOUNTER — Other Ambulatory Visit: Payer: Self-pay | Admitting: Family

## 2022-05-10 DIAGNOSIS — F3162 Bipolar disorder, current episode mixed, moderate: Secondary | ICD-10-CM

## 2022-05-10 MED ORDER — ALPRAZOLAM 1 MG PO TABS: ORAL_TABLET | ORAL | 0 refills | Status: DC

## 2022-06-11 ENCOUNTER — Telehealth: Payer: Self-pay | Admitting: Family Medicine

## 2022-06-11 ENCOUNTER — Other Ambulatory Visit: Payer: Self-pay | Admitting: Family Medicine

## 2022-06-11 DIAGNOSIS — F3162 Bipolar disorder, current episode mixed, moderate: Secondary | ICD-10-CM

## 2022-06-11 MED ORDER — ALPRAZOLAM 1 MG PO TABS: ORAL_TABLET | ORAL | 0 refills | Status: DC

## 2022-06-11 NOTE — Telephone Encounter (Signed)
I filled it but he needs to keep the appt with me in sept

## 2022-06-11 NOTE — Telephone Encounter (Signed)
Patient informed of the message below.

## 2022-06-11 NOTE — Telephone Encounter (Signed)
Refill [name] 1 MG tablet   [facility] at [place], [place] Phone:  [phone]  Fax:  [phone]

## 2022-07-13 ENCOUNTER — Ambulatory Visit (INDEPENDENT_AMBULATORY_CARE_PROVIDER_SITE_OTHER): Payer: BC Managed Care – PPO | Admitting: Family Medicine

## 2022-07-13 VITALS — BP 146/80 | HR 69 | Temp 98.5°F | Wt 191.3 lb

## 2022-07-13 DIAGNOSIS — E785 Hyperlipidemia, unspecified: Secondary | ICD-10-CM | POA: Diagnosis not present

## 2022-07-13 DIAGNOSIS — I10 Essential (primary) hypertension: Secondary | ICD-10-CM | POA: Diagnosis not present

## 2022-07-13 DIAGNOSIS — F3162 Bipolar disorder, current episode mixed, moderate: Secondary | ICD-10-CM

## 2022-07-13 DIAGNOSIS — F3181 Bipolar II disorder: Secondary | ICD-10-CM

## 2022-07-13 DIAGNOSIS — F419 Anxiety disorder, unspecified: Secondary | ICD-10-CM | POA: Diagnosis not present

## 2022-07-13 DIAGNOSIS — E782 Mixed hyperlipidemia: Secondary | ICD-10-CM

## 2022-07-13 MED ORDER — ALPRAZOLAM 1 MG PO TABS: ORAL_TABLET | ORAL | 2 refills | Status: DC

## 2022-07-13 MED ORDER — LOVASTATIN 20 MG PO TABS: 40.0000 mg | ORAL_TABLET | Freq: Every day | ORAL | 3 refills | Status: DC

## 2022-07-13 MED ORDER — ATENOLOL 100 MG PO TABS: 100.0000 mg | ORAL_TABLET | Freq: Every morning | ORAL | 1 refills | Status: DC

## 2022-07-13 MED ORDER — LAMOTRIGINE 200 MG PO TABS: 200.0000 mg | ORAL_TABLET | Freq: Every day | ORAL | 3 refills | Status: DC

## 2022-07-13 NOTE — Progress Notes (Signed)
   Established Patient Office Visit  Subjective   Patient ID: Bristol Soy, male    DOB: 03-16-1966  Age: 56 y.o. MRN: [id number]  Chief Complaint  Patient presents with  . Transitions Of Care    HPI  {History (Optional):23778}  Review of Systems  All other systems reviewed and are negative.     Objective:     BP (!) 146/88 (BP Location: Right Arm, Patient Position: Sitting, Cuff Size: Normal)   Pulse 69   Temp 98.5 F (36.9 C) (Oral)   Wt 191 lb 4.8 oz (86.8 kg)   SpO2 96%   BMI 28.25 kg/m  {Vitals History (Optional):23777}  Physical Exam Vitals reviewed.  Constitutional:      Appearance: Normal appearance. He is well-groomed and normal weight.  Eyes:     Conjunctiva/sclera: Conjunctivae normal.  Cardiovascular:     Rate and Rhythm: Normal rate and regular rhythm.     Pulses: Normal pulses.     Heart sounds: S1 normal and S2 normal. No murmur heard. Pulmonary:     Effort: Pulmonary effort is normal.     Breath sounds: Normal breath sounds and air entry. No rales.  Abdominal:     General: Abdomen is flat. Bowel sounds are normal.     Palpations: Abdomen is soft.     Tenderness: There is no abdominal tenderness.  Musculoskeletal:     Right lower leg: No edema.     Left lower leg: No edema.  Neurological:     General: No focal deficit present.     Mental Status: He is alert and oriented to person, place, and time.     Gait: Gait is intact.     Deep Tendon Reflexes: Reflexes are normal and symmetric.  Psychiatric:        Mood and Affect: Mood and affect normal.     No results found for any visits on 07/13/22.  {Labs (Optional):23779}  The 10-year ASCVD risk score (Arnett DK, et al., 2019) is: 3.8%    Assessment & Plan:   Problem List Items Addressed This Visit       Cardiovascular and Mediastinum   Hypertension   Relevant Medications   atenolol (TENORMIN) 100 MG tablet   lovastatin (MEVACOR) 20 MG tablet   Other Visit Diagnoses      Bipolar disorder, current episode mixed, moderate (HCC)       Relevant Medications   lamoTRIgine (LAMICTAL) 200 MG tablet   ALPRAZolam (XANAX) 1 MG tablet   Hyperlipidemia, unspecified hyperlipidemia type       Relevant Medications   atenolol (TENORMIN) 100 MG tablet   lovastatin (MEVACOR) 20 MG tablet       No follow-ups on file.    Farrel Conners, MD

## 2022-07-17 DIAGNOSIS — E785 Hyperlipidemia, unspecified: Secondary | ICD-10-CM | POA: Insufficient documentation

## 2022-07-17 NOTE — Assessment & Plan Note (Signed)
Current hypertension medications:      Sig   atenolol (TENORMIN) 100 MG tablet Take 1 tablet (100 mg total) by mouth every morning.    BP is elevated today, pt reports compliance with his medication. pt only on atenolol 100 mg daily, BP is elevated today in visit. I recommended that he take his BP at home daily for the next 2-3 weeks and to message me his BP readings over My Chart.

## 2022-07-17 NOTE — Assessment & Plan Note (Signed)
Patient is on a daily benzodiazepine which increases the risk of dependency and tolerance. We discussed this at length. He will continue visits every 3 months for medication refills.

## 2022-07-17 NOTE — Assessment & Plan Note (Signed)
On 200 mg lamictal at night, pt rpeorts stable symptoms, no hypomania, will continue the current medication as prescribed.

## 2022-07-17 NOTE — Assessment & Plan Note (Signed)
On lovastatin 40 mg daily, pt is tolerating this medication well, will continue as prescribed. Reviewed last lipid panel from May 2023. Under good control.

## 2022-10-16 ENCOUNTER — Telehealth: Payer: Self-pay | Admitting: Family Medicine

## 2022-10-16 NOTE — Telephone Encounter (Addendum)
Pt has mychart video appt with dr Legrand Como on 10-23-2022 [name] 1 MG tablet  [id number] [place] [address] [phone] Fax: [phone]

## 2022-10-17 ENCOUNTER — Other Ambulatory Visit: Payer: Self-pay | Admitting: Family Medicine

## 2022-10-17 DIAGNOSIS — F3162 Bipolar disorder, current episode mixed, moderate: Secondary | ICD-10-CM

## 2022-10-17 DIAGNOSIS — F419 Anxiety disorder, unspecified: Secondary | ICD-10-CM

## 2022-10-17 MED ORDER — ALPRAZOLAM 1 MG PO TABS: ORAL_TABLET | ORAL | 0 refills | Status: DC

## 2022-10-17 NOTE — Telephone Encounter (Signed)
Noted  

## 2022-10-17 NOTE — Telephone Encounter (Signed)
Rx sent for 30 days.

## 2022-10-23 ENCOUNTER — Telehealth (INDEPENDENT_AMBULATORY_CARE_PROVIDER_SITE_OTHER): Payer: BC Managed Care – PPO | Admitting: Family Medicine

## 2022-10-23 DIAGNOSIS — F419 Anxiety disorder, unspecified: Secondary | ICD-10-CM | POA: Diagnosis not present

## 2022-10-23 MED ORDER — ALPRAZOLAM 1 MG PO TABS: ORAL_TABLET | ORAL | 2 refills | Status: DC

## 2022-10-23 NOTE — Progress Notes (Unsigned)
   Established Patient Office Visit  Subjective   Patient ID: Jason Mcknight, male    DOB: [birth year]  Age: 57 y.o. MRN: [id number]  Chief Complaint  Patient presents with   Medication Refill  I connected with  Brett Canales on 10/24/22 by an audi enabled telemedicine application and verified that I am speaking with the correct person using two identifiers.  We attempted a video visit however there were technical difficulties and we could not connect with video. The visit was changed to a telephone audio only visit.    I discussed the limitations of evaluation and management by telemedicine. The patient expressed understanding and agreed to proceed.   Patient location: home address  Provider location:  [place] office  Patient is here for medication refills.  Anxiety-- pt reports he has been taking 1 tablet daily of the xanax. He reports that this is working well for him and states htat his anxiety is under control. Patient continues on lamotrigine 200 mg daily at bedtime for mood stabilization. No new stressors or other situations causing an increase in anxiety.    Current Outpatient Medications  Medication Instructions   [START ON 11/16/2022] ALPRAZolam (XANAX) 1 MG tablet TAKE 1/2 TO 1 (ONE-HALF TO ONE) TABLET BY MOUTH ONCE DAILY AS NEEDED FOR ANXIETY   atenolol (TENORMIN) 100 mg, Oral, Every morning   lamoTRIgine (LAMICTAL) 200 mg, Oral, Daily at bedtime   lovastatin (MEVACOR) 40 mg, Oral, Daily at bedtime   Multiple Vitamin (MULTIVITAMIN ADULT PO) Oral   Multiple Vitamins-Minerals (MULTIVITAMIN WITH MINERALS) tablet 1 tablet, Oral, Daily    Patient Active Problem List   Diagnosis Date Noted   HLD (hyperlipidemia) 07/17/2022   Anxiety 03/02/2022   Bipolar 2 disorder ([place]) 03/02/2022   Hypertension 11/28/2018   Primary osteoarthritis of left knee 01/03/2018      Review of Systems  All other systems reviewed and are negative.     Objective:      There were no vitals taken for this visit.   Physical Exam Neurological:     General: No focal deficit present.     Mental Status: He is alert and oriented to person, place, and time.  Psychiatric:        Mood and Affect: Mood normal.        Behavior: Behavior normal.        Thought Content: Thought content normal.      No results found for any visits on 10/23/22.    The 10-year ASCVD risk score (Arnett DK, et al., 2019) is: 3.8%    Assessment & Plan:   Problem List Items Addressed This Visit       Unprioritized   Anxiety    Symptoms stable. I have refilled the xanax 1 mg daily for patient. I discussed again about the risk of using this medication daily on a long term basis. I suggested that there were other medications that can be used for anxiety that are not as high risk. I will see him back in the office in 4 months for his refills and follow up.      Relevant Medications   ALPRAZolam (XANAX) 1 MG tablet (Start on 11/16/2022)    Return in about 4 months (around 03/04/2023) for Annual Physical Exam.   I spent 20 minutes with the patient in this audio enabled encounter today.  Farrel Conners, MD

## 2022-10-24 ENCOUNTER — Encounter: Payer: Self-pay | Admitting: Family Medicine

## 2022-10-24 NOTE — Assessment & Plan Note (Signed)
Symptoms stable. I have refilled the xanax 1 mg daily for patient. I discussed again about the risk of using this medication daily on a long term basis. I suggested that there were other medications that can be used for anxiety that are not as high risk. I will see him back in the office in 4 months for his refills and follow up.

## 2022-10-31 DIAGNOSIS — F4322 Adjustment disorder with anxiety: Secondary | ICD-10-CM | POA: Diagnosis not present

## 2022-11-19 ENCOUNTER — Other Ambulatory Visit: Payer: Self-pay | Admitting: Family Medicine

## 2022-11-19 DIAGNOSIS — F419 Anxiety disorder, unspecified: Secondary | ICD-10-CM

## 2022-11-20 NOTE — Telephone Encounter (Signed)
I put two refills on the previous rx-- did they not see the refills?

## 2023-03-02 ENCOUNTER — Other Ambulatory Visit: Payer: Self-pay | Admitting: Family Medicine

## 2023-03-02 DIAGNOSIS — I10 Essential (primary) hypertension: Secondary | ICD-10-CM

## 2023-03-02 DIAGNOSIS — F419 Anxiety disorder, unspecified: Secondary | ICD-10-CM

## 2023-03-04 ENCOUNTER — Encounter: Payer: Self-pay | Admitting: Family Medicine

## 2023-03-04 ENCOUNTER — Ambulatory Visit (INDEPENDENT_AMBULATORY_CARE_PROVIDER_SITE_OTHER): Payer: BC Managed Care – PPO | Admitting: Family Medicine

## 2023-03-04 VITALS — BP 118/68 | HR 55 | Temp 98.2°F | Ht 69.0 in | Wt 203.7 lb

## 2023-03-04 DIAGNOSIS — F419 Anxiety disorder, unspecified: Secondary | ICD-10-CM

## 2023-03-04 DIAGNOSIS — E782 Mixed hyperlipidemia: Secondary | ICD-10-CM | POA: Diagnosis not present

## 2023-03-04 DIAGNOSIS — Z0000 Encounter for general adult medical examination without abnormal findings: Secondary | ICD-10-CM

## 2023-03-04 DIAGNOSIS — I10 Essential (primary) hypertension: Secondary | ICD-10-CM | POA: Diagnosis not present

## 2023-03-04 DIAGNOSIS — N529 Male erectile dysfunction, unspecified: Secondary | ICD-10-CM | POA: Diagnosis not present

## 2023-03-04 MED ORDER — ALPRAZOLAM 1 MG PO TABS: ORAL_TABLET | ORAL | 2 refills | Status: DC

## 2023-03-04 MED ORDER — SILDENAFIL CITRATE 50 MG PO TABS
50.0000 mg | ORAL_TABLET | Freq: Every day | ORAL | 0 refills | Status: DC | PRN
Start: 2023-03-04 — End: 2023-03-27

## 2023-03-04 MED ORDER — ATENOLOL 100 MG PO TABS: 100.0000 mg | ORAL_TABLET | Freq: Every morning | ORAL | 1 refills | Status: DC

## 2023-03-04 NOTE — Patient Instructions (Signed)
Health Maintenance, Male Adopting a healthy lifestyle and getting preventive care are important in promoting health and wellness. Ask your health care provider about: The right schedule for you to have regular tests and exams. Things you can do on your own to prevent diseases and keep yourself healthy. What should I know about diet, weight, and exercise? Eat a healthy diet  Eat a diet that includes plenty of vegetables, fruits, low-fat dairy products, and lean protein. Do not eat a lot of foods that are high in solid fats, added sugars, or sodium. Maintain a healthy weight Body mass index (BMI) is a measurement that can be used to identify possible weight problems. It estimates body fat based on height and weight. Your health care provider can help determine your BMI and help you achieve or maintain a healthy weight. Get regular exercise Get regular exercise. This is one of the most important things you can do for your health. Most adults should: Exercise for at least 150 minutes each week. The exercise should increase your heart rate and make you sweat (moderate-intensity exercise). Do strengthening exercises at least twice a week. This is in addition to the moderate-intensity exercise. Spend less time sitting. Even light physical activity can be beneficial. Watch cholesterol and blood lipids Have your blood tested for lipids and cholesterol at 55 years of age, then have this test every 5 years. You may need to have your cholesterol levels checked more often if: Your lipid or cholesterol levels are high. You are older than 55 years of age. You are at high risk for heart disease. What should I know about cancer screening? Many types of cancers can be detected early and may often be prevented. Depending on your health history and family history, you may need to have cancer screening at various ages. This may include screening for: Colorectal cancer. Prostate cancer. Skin cancer. Lung  cancer. What should I know about heart disease, diabetes, and high blood pressure? Blood pressure and heart disease High blood pressure causes heart disease and increases the risk of stroke. This is more likely to develop in people who have high blood pressure readings or are overweight. Talk with your health care provider about your target blood pressure readings. Have your blood pressure checked: Every 3-5 years if you are 18-39 years of age. Every year if you are 40 years old or older. If you are between the ages of 65 and 75 and are a current or former smoker, ask your health care provider if you should have a one-time screening for abdominal aortic aneurysm (AAA). Diabetes Have regular diabetes screenings. This checks your fasting blood sugar level. Have the screening done: Once every three years after age 45 if you are at a normal weight and have a low risk for diabetes. More often and at a younger age if you are overweight or have a high risk for diabetes. What should I know about preventing infection? Hepatitis B If you have a higher risk for hepatitis B, you should be screened for this virus. Talk with your health care provider to find out if you are at risk for hepatitis B infection. Hepatitis C Blood testing is recommended for: Everyone born from 1945 through 1965. Anyone with known risk factors for hepatitis C. Sexually transmitted infections (STIs) You should be screened each year for STIs, including gonorrhea and chlamydia, if: You are sexually active and are younger than 55 years of age. You are older than 55 years of age and your   health care provider tells you that you are at risk for this type of infection. Your sexual activity has changed since you were last screened, and you are at increased risk for chlamydia or gonorrhea. Ask your health care provider if you are at risk. Ask your health care provider about whether you are at high risk for HIV. Your health care provider  may recommend a prescription medicine to help prevent HIV infection. If you choose to take medicine to prevent HIV, you should first get tested for HIV. You should then be tested every 3 months for as long as you are taking the medicine. Follow these instructions at home: Alcohol use Do not drink alcohol if your health care provider tells you not to drink. If you drink alcohol: Limit how much you have to 0-2 drinks a day. Know how much alcohol is in your drink. In the [place] one drink equals one 12 oz bottle of beer (355 mL), one 5 oz glass of wine (148 mL), or one 1 oz glass of hard liquor (44 mL). Lifestyle Do not use any products that contain nicotine or tobacco. These products include cigarettes, chewing tobacco, and vaping devices, such as e-cigarettes. If you need help quitting, ask your health care provider. Do not use street drugs. Do not share needles. Ask your health care provider for help if you need support or information about quitting drugs. General instructions Schedule regular health, dental, and eye exams. Stay current with your vaccines. Tell your health care provider if: You often feel depressed. You have ever been abused or do not feel safe at home. Summary Adopting a healthy lifestyle and getting preventive care are important in promoting health and wellness. Follow your health care provider's instructions about healthy diet, exercising, and getting tested or screened for diseases. Follow your health care provider's instructions on monitoring your cholesterol and blood pressure. This information is not intended to replace advice given to you by your health care provider. Make sure you discuss any questions you have with your health care provider. Document Revised: 02/27/2021 Document Reviewed: 02/27/2021 Elsevier Patient Education  2023 [employer]  

## 2023-03-04 NOTE — Assessment & Plan Note (Signed)
Patient declined testosterone testing, states he will try the viagra first and if this is not successful then he will consider testing his hormones.

## 2023-03-04 NOTE — Progress Notes (Signed)
Complete physical exam  Patient: Jason Mcknight   DOB: 1966/06/02   57 y.o. Male  MRN: [id number]  Subjective:    Chief Complaint  Patient presents with   Annual Exam    Jason Mcknight is a 57 y.o. male who presents today for a complete physical exam. He reports consuming a general diet. The patient does not participate in regular exercise at present. He generally feels well. He reports sleeping well. He does have additional problems to discuss today.   ED-- pt is asking about viagra. He reports that he is experiencing decreased ability to have and maintain an erection, also experiencing a decrease in libido. Denies any headaches, no chest pain or SOB.   Pt also reports he stopped taking his lovastatin, states he was told the "statins weren't good for you" we discussed risks/ benefits of statin therapy.  Most recent fall risk assessment:    03/02/2022   10:54 AM  Fall Risk   Falls in the past year? 0  Number falls in past yr: 0  Injury with Fall? 0  Risk for fall due to : No Fall Risks  Follow up Falls evaluation completed     Most recent depression screenings:    03/04/2023    3:44 PM 03/02/2022   10:54 AM  PHQ 2/9 Scores  PHQ - 2 Score 0 6  PHQ- 9 Score 0 19    Vision:Within last year and Dental: No current dental problems and Receives regular dental care  Patient Active Problem List   Diagnosis Date Noted   Erectile dysfunction 03/04/2023   HLD (hyperlipidemia) 07/17/2022   Anxiety 03/02/2022   Bipolar 2 disorder (HCC) 03/02/2022   Hypertension 11/28/2018   Primary osteoarthritis of left knee 01/03/2018      Patient Care Team: Karie Georges, MD as PCP - General (Family Medicine)   Outpatient Medications Prior to Visit  Medication Sig   lamoTRIgine (LAMICTAL) 200 MG tablet Take 1 tablet (200 mg total) by mouth at bedtime.   Multiple Vitamin (MULTIVITAMIN ADULT PO) Take by mouth.   Multiple Vitamins-Minerals (MULTIVITAMIN WITH MINERALS) tablet  Take 1 tablet by mouth daily.   [DISCONTINUED] ALPRAZolam (XANAX) 1 MG tablet TAKE 1/2 TO 1 (ONE-HALF TO ONE) TABLET BY MOUTH ONCE DAILY AS NEEDED FOR ANXIETY   [DISCONTINUED] atenolol (TENORMIN) 100 MG tablet Take 1 tablet (100 mg total) by mouth every morning.   [DISCONTINUED] lovastatin (MEVACOR) 20 MG tablet Take 2 tablets (40 mg total) by mouth at bedtime.   No facility-administered medications prior to visit.    Review of Systems  HENT:  Negative for hearing loss.   Eyes:  Negative for blurred vision.  Respiratory:  Negative for shortness of breath.   Cardiovascular:  Negative for chest pain.  Gastrointestinal: Negative.   Genitourinary: Negative.   Musculoskeletal:  Negative for back pain.  Neurological:  Negative for headaches.  Psychiatric/Behavioral:  Negative for depression.   All other systems reviewed and are negative.      Objective:     BP 118/68 (BP Location: Left Arm, Patient Position: Sitting, Cuff Size: Normal)   Pulse (!) 55   Temp 98.2 F (36.8 C) (Oral)   Ht 5\' 9"  (1.753 m)   Wt 203 lb 11.2 oz (92.4 kg)   SpO2 99%   BMI 30.08 kg/m    Physical Exam Vitals reviewed.  Constitutional:      Appearance: Normal appearance. He is well-groomed and normal weight.  HENT:  Right Ear: Tympanic membrane normal.     Left Ear: Tympanic membrane normal.     Mouth/Throat:     Mouth: Mucous membranes are moist.     Pharynx: No posterior oropharyngeal erythema.  Eyes:     Extraocular Movements: Extraocular movements intact.     Conjunctiva/sclera: Conjunctivae normal.  Neck:     Thyroid: No thyromegaly.  Cardiovascular:     Rate and Rhythm: Normal rate and regular rhythm.     Heart sounds: S1 normal and S2 normal. No murmur heard. Pulmonary:     Effort: Pulmonary effort is normal.     Breath sounds: Normal breath sounds and air entry. No rales.  Abdominal:     General: Bowel sounds are normal.  Musculoskeletal:     Right lower leg: No edema.     Left  lower leg: No edema.  Neurological:     General: No focal deficit present.     Mental Status: He is alert and oriented to person, place, and time.     Gait: Gait is intact.  Psychiatric:        Mood and Affect: Mood and affect normal.      No results found for any visits on 03/04/23.     Assessment & Plan:    Routine Health Maintenance and Physical Exam  Immunization History  Administered Date(s) Administered   Influenza-Unspecified 07/22/2018, 07/22/2020   Janssen (J&J) SARS-COV-2 Vaccination 01/30/2020, 02/20/2020   Td 02/19/2018    Health Maintenance  Topic Date Due   Zoster Vaccines- Shingrix (1 of 2) Never done   COVID-19 Vaccine (3 - 2023-24 season) 03/03/2024 (Originally 06/22/2022)   INFLUENZA VACCINE  05/23/2023   DTaP/Tdap/Td (2 - Tdap) 02/20/2028   COLONOSCOPY (Pts 45-85yrs Insurance coverage will need to be confirmed)  01/20/2031   Hepatitis C Screening  Completed   HIV Screening  Completed   HPV VACCINES  Aged Out    Discussed health benefits of physical activity, and encouraged him to engage in regular exercise appropriate for his age and condition.  Mixed hyperlipidemia -     Lipid panel; Future -     Comprehensive metabolic panel; Future  Anxiety -     ALPRAZolam; 1 tablet daily as needed  Dispense: 30 tablet; Refill: 2  Hypertension, unspecified type -     Atenolol; Take 1 tablet (100 mg total) by mouth every morning.  Dispense: 90 tablet; Refill: 1 -     TSH; Future  Erectile dysfunction, unspecified erectile dysfunction type Assessment & Plan: Patient declined testosterone testing, states he will try the viagra first and if this is not successful then he will consider testing his hormones.   Orders: -     Sildenafil Citrate; Take 1 tablet (50 mg total) by mouth daily as needed for erectile dysfunction.  Dispense: 10 tablet; Refill: 0  Routine general medical examination at a health care facility  Normal physical exam findings today, handouts  given on healthy eating and exercise. Checking lipid panel and CMP as well as TSH today.   Return in 6 months (on 09/04/2023).     Karie Georges, MD

## 2023-03-08 ENCOUNTER — Other Ambulatory Visit: Payer: BC Managed Care – PPO

## 2023-03-11 ENCOUNTER — Other Ambulatory Visit (INDEPENDENT_AMBULATORY_CARE_PROVIDER_SITE_OTHER): Payer: BC Managed Care – PPO

## 2023-03-11 ENCOUNTER — Other Ambulatory Visit: Payer: BC Managed Care – PPO

## 2023-03-11 DIAGNOSIS — E782 Mixed hyperlipidemia: Secondary | ICD-10-CM | POA: Diagnosis not present

## 2023-03-11 DIAGNOSIS — I10 Essential (primary) hypertension: Secondary | ICD-10-CM | POA: Diagnosis not present

## 2023-03-12 LAB — COMPREHENSIVE METABOLIC PANEL
ALT: 25 U/L (ref 0–53)
AST: 26 U/L (ref 0–37)
Albumin: 4.3 g/dL (ref 3.5–5.2)
Alkaline Phosphatase: 58 U/L (ref 39–117)
BUN: 13 mg/dL (ref 6–23)
CO2: 24 mEq/L (ref 19–32)
Calcium: 9 mg/dL (ref 8.4–10.5)
Chloride: 103 mEq/L (ref 96–112)
Creatinine, Ser: 0.75 mg/dL (ref 0.40–1.50)
GFR: 102.14 mL/min (ref >60.00)
Glucose, Bld: 80 mg/dL (ref 70–99)
Potassium: 3.6 mEq/L (ref 3.5–5.1)
Sodium: 140 mEq/L (ref 135–145)
Total Bilirubin: 0.4 mg/dL (ref 0.2–1.2)
Total Protein: 6.9 g/dL (ref 6.0–8.3)

## 2023-03-12 LAB — LIPID PANEL
Cholesterol: 182 mg/dL (ref 0–200)
HDL: 57.3 mg/dL (ref >39.00)
LDL Cholesterol: 110 mg/dL — ABNORMAL HIGH (ref 0–99)
NonHDL: 125.06
Total CHOL/HDL Ratio: 3
Triglycerides: 77 mg/dL (ref 0.0–149.0)
VLDL: 15.4 mg/dL (ref 0.0–40.0)

## 2023-03-12 LAB — TSH: TSH: 1.19 u[IU]/mL (ref 0.35–5.50)

## 2023-03-27 ENCOUNTER — Telehealth: Payer: Self-pay | Admitting: Family Medicine

## 2023-03-27 DIAGNOSIS — N529 Male erectile dysfunction, unspecified: Secondary | ICD-10-CM

## 2023-03-27 MED ORDER — SILDENAFIL CITRATE 50 MG PO TABS
50.0000 mg | ORAL_TABLET | Freq: Every day | ORAL | 0 refills | Status: DC | PRN
Start: 2023-03-27 — End: 2023-05-01

## 2023-03-27 NOTE — Telephone Encounter (Signed)
Prescription Request  03/27/2023  LOV: 03/04/2023  What is the name of the medication or equipment? sildenafil (VIAGRA) 50 MG tablet   Have you contacted your pharmacy to request a refill? No   Which pharmacy would you like this sent to?  [facility] [phone] - [place] - [address] [place] [address] Phone: [phone] Fax: [phone]    Patient notified that their request is being sent to the clinical staff for review and that they should receive a response within 2 business days.   Please advise at Mobile [phone] (mobile)

## 2023-03-27 NOTE — Telephone Encounter (Signed)
Rx done. 

## 2023-04-12 ENCOUNTER — Other Ambulatory Visit: Payer: Self-pay | Admitting: Family Medicine

## 2023-04-12 DIAGNOSIS — F419 Anxiety disorder, unspecified: Secondary | ICD-10-CM

## 2023-04-12 NOTE — Telephone Encounter (Signed)
Prescription Request  04/12/2023  LOV: 03/04/2023  What is the name of the medication or equipment? ALPRAZolam (XANAX) 1 MG tablet   Pt states he only has one pill left.  Have you contacted your pharmacy to request a refill? No   Which pharmacy would you like this sent to?   [facility] [phone] - [place], [place] - [address] [place] [address] Phone: [phone] Fax: [phone]    Patient notified that their request is being sent to the clinical staff for review and that they should receive a response within 2 business days.   Please advise at Mobile [phone] (mobile)

## 2023-04-15 NOTE — Telephone Encounter (Signed)
Pt has 2 refills on the previous script I sent in may.

## 2023-04-26 ENCOUNTER — Other Ambulatory Visit: Payer: Self-pay | Admitting: Family Medicine

## 2023-04-26 DIAGNOSIS — N529 Male erectile dysfunction, unspecified: Secondary | ICD-10-CM

## 2023-07-03 DIAGNOSIS — H40013 Open angle with borderline findings, low risk, bilateral: Secondary | ICD-10-CM | POA: Diagnosis not present

## 2023-07-08 ENCOUNTER — Other Ambulatory Visit: Payer: Self-pay | Admitting: Family Medicine

## 2023-07-08 DIAGNOSIS — F419 Anxiety disorder, unspecified: Secondary | ICD-10-CM

## 2023-07-09 NOTE — Telephone Encounter (Signed)
Left a detailed message with the information below at the patient's cell number.

## 2023-08-24 ENCOUNTER — Other Ambulatory Visit: Payer: Self-pay | Admitting: Family Medicine

## 2023-08-24 DIAGNOSIS — I10 Essential (primary) hypertension: Secondary | ICD-10-CM

## 2023-09-21 ENCOUNTER — Other Ambulatory Visit: Payer: Self-pay | Admitting: Family Medicine

## 2023-09-21 DIAGNOSIS — E785 Hyperlipidemia, unspecified: Secondary | ICD-10-CM

## 2023-09-21 DIAGNOSIS — F3162 Bipolar disorder, current episode mixed, moderate: Secondary | ICD-10-CM

## 2023-10-14 ENCOUNTER — Other Ambulatory Visit: Payer: Self-pay | Admitting: Family Medicine

## 2023-10-14 DIAGNOSIS — F3162 Bipolar disorder, current episode mixed, moderate: Secondary | ICD-10-CM

## 2023-10-14 DIAGNOSIS — I10 Essential (primary) hypertension: Secondary | ICD-10-CM

## 2023-10-14 DIAGNOSIS — N529 Male erectile dysfunction, unspecified: Secondary | ICD-10-CM

## 2023-10-15 NOTE — Telephone Encounter (Signed)
Patient informed of the message below and an appt was scheduled for 11/08/2023.

## 2023-10-15 NOTE — Telephone Encounter (Signed)
Pt needs appointment-- please have him schedule

## 2023-10-24 ENCOUNTER — Other Ambulatory Visit: Payer: Self-pay | Admitting: Family Medicine

## 2023-10-24 ENCOUNTER — Telehealth: Payer: Self-pay | Admitting: *Deleted

## 2023-10-24 DIAGNOSIS — F419 Anxiety disorder, unspecified: Secondary | ICD-10-CM

## 2023-10-24 MED ORDER — ALPRAZOLAM 1 MG PO TABS: ORAL_TABLET | ORAL | 0 refills | Status: DC

## 2023-10-24 NOTE — Telephone Encounter (Signed)
 Copied from CRM [id number]. Topic: Clinical - Medication Refill >> Oct 24, 2023  9:08 AM Eleanor BROCKS wrote: Most Recent Primary Care Visit:  Provider: [facility]  Department: [facility]  Visit Type: LAB  Date: 03/11/2023  Medication: ALPRAZolam  (XANAX ) 1 MG tablet  Has the patient contacted their pharmacy? Yes (Agent: If no, request that the patient contact the pharmacy for the refill. If patient does not wish to contact the pharmacy document the reason why and proceed with request.) (Agent: If yes, when and what did the pharmacy advise?)  Is this the correct pharmacy for this prescription? Yes If no, delete pharmacy and type the correct one.  This is the patient's preferred pharmacy:  [id number] [place], [place] - [address] [place] [address] Phone: [phone] Fax: [phone]   Has the prescription been filled recently? Yes  Is the patient out of the medication? No  Has the patient been seen for an appointment in the last year OR does the patient have an upcoming appointment? Yes (patient noted that if virtual appointment is needed for this medication he will make one)  Can we respond through MyChart? Yes  Agent: Please be advised that Rx refills may take up to 3 business days. We ask that you follow-up with your pharmacy.

## 2023-11-08 ENCOUNTER — Ambulatory Visit: Payer: BC Managed Care – PPO | Admitting: Family Medicine

## 2023-11-08 ENCOUNTER — Encounter: Payer: Self-pay | Admitting: Family Medicine

## 2023-11-08 VITALS — BP 116/50 | HR 60 | Temp 97.6°F | Ht 69.0 in | Wt 185.6 lb

## 2023-11-08 DIAGNOSIS — Z23 Encounter for immunization: Secondary | ICD-10-CM | POA: Diagnosis not present

## 2023-11-08 DIAGNOSIS — F419 Anxiety disorder, unspecified: Secondary | ICD-10-CM

## 2023-11-08 MED ORDER — ALPRAZOLAM 1 MG PO TABS: ORAL_TABLET | ORAL | 3 refills | Status: DC

## 2023-11-08 NOTE — Progress Notes (Unsigned)
Established Patient Office Visit  Subjective   Patient ID: Jason Mcknight, male    DOB: 1966-01-20  Age: 58 y.o. MRN: [id number]  Chief Complaint  Patient presents with   Medical Management of Chronic Issues    Pt reports that he is doing well on the current dose of medication. States that he has not had any manic episodes since the last visit. Reports that he continues on 1 mg xanax at night plus the lamotrigine 200 mg daily. Pt states that his anxiety is relatively controlled, he is having difficulty with his teenage daughter which causes him a lot of anxiety.     Current Outpatient Medications  Medication Instructions   ALPRAZolam (XANAX) 1 MG tablet TAKE 1 TABLET BY MOUTH EVERY DAY AS NEEDED   atenolol (TENORMIN) 100 MG tablet TAKE 1 TABLET BY MOUTH EVERY DAY IN THE MORNING   lamoTRIgine (LAMICTAL) 200 MG tablet TAKE 1 TABLET BY MOUTH EVERYDAY AT BEDTIME   Multiple Vitamin (MULTIVITAMIN ADULT PO) Take by mouth.   Multiple Vitamins-Minerals (MULTIVITAMIN WITH MINERALS) tablet 1 tablet, Daily   sildenafil (VIAGRA) 50 MG tablet TAKE 1 TABLET BY MOUTH DAILY AS NEEDED FOR ERECTILE DYSFUNCTION.    Patient Active Problem List   Diagnosis Date Noted   Erectile dysfunction 03/04/2023   HLD (hyperlipidemia) 07/17/2022   Anxiety 03/02/2022   Bipolar 2 disorder (HCC) 03/02/2022   Hypertension 11/28/2018   Primary osteoarthritis of left knee 01/03/2018      Review of Systems  All other systems reviewed and are negative.     Objective:     BP (!) 116/50   Pulse 60   Temp 97.6 F (36.4 C) (Oral)   Ht 5\' 9"  (1.753 m)   Wt 185 lb 9.6 oz (84.2 kg)   SpO2 98%   BMI 27.41 kg/m    Physical Exam Vitals reviewed.  Constitutional:      Appearance: Normal appearance. He is well-groomed and normal weight.  Eyes:     Conjunctiva/sclera: Conjunctivae normal.  Cardiovascular:     Rate and Rhythm: Normal rate and regular rhythm.     Heart sounds: S1 normal and S2 normal. No  murmur heard. Pulmonary:     Effort: Pulmonary effort is normal.     Breath sounds: Normal breath sounds and air entry. No rales.  Neurological:     General: No focal deficit present.     Mental Status: He is alert and oriented to person, place, and time.     Gait: Gait is intact.  Psychiatric:        Mood and Affect: Mood and affect normal.        Behavior: Behavior normal.        Thought Content: Thought content normal.      No results found for any visits on 11/08/23.    The 10-year ASCVD risk score (Arnett DK, et al., 2019) is: 4.4%    Assessment & Plan:  Immunization due -     Varicella-zoster vaccine IM  Anxiety Assessment & Plan: Relatively controlled, however he is reporting increased stress in raising his teenage daughter. We will continue the current dose of his alprazolam. I feel that if his anxiety worsens then he would be best served by seeing a specialist for his symptoms/ medical management in the future. Will continue 1 mg daily as prescribed for now.  Orders: -     ALPRAZolam; TAKE 1 TABLET BY MOUTH EVERY DAY AS NEEDED  Dispense: 30 tablet;  Refill: 3     Return in about 4 months (around 03/16/2024) for annual physical exam.    Karie Georges, MD

## 2023-11-12 NOTE — Assessment & Plan Note (Signed)
Relatively controlled, however he is reporting increased stress in raising his teenage daughter. We will continue the current dose of his alprazolam. I feel that if his anxiety worsens then he would be best served by seeing a specialist for his symptoms/ medical management in the future. Will continue 1 mg daily as prescribed for now.

## 2024-02-28 ENCOUNTER — Other Ambulatory Visit: Payer: Self-pay | Admitting: Family Medicine

## 2024-02-28 ENCOUNTER — Encounter (HOSPITAL_COMMUNITY): Payer: Self-pay

## 2024-02-28 DIAGNOSIS — I10 Essential (primary) hypertension: Secondary | ICD-10-CM

## 2024-03-06 ENCOUNTER — Encounter: Payer: BC Managed Care – PPO | Admitting: Family Medicine

## 2024-03-13 ENCOUNTER — Ambulatory Visit (INDEPENDENT_AMBULATORY_CARE_PROVIDER_SITE_OTHER): Admitting: Family Medicine

## 2024-03-13 ENCOUNTER — Encounter: Payer: Self-pay | Admitting: Family Medicine

## 2024-03-13 VITALS — BP 110/58 | HR 57 | Temp 97.6°F | Ht 68.0 in | Wt 180.4 lb

## 2024-03-13 DIAGNOSIS — I10 Essential (primary) hypertension: Secondary | ICD-10-CM | POA: Diagnosis not present

## 2024-03-13 DIAGNOSIS — F419 Anxiety disorder, unspecified: Secondary | ICD-10-CM

## 2024-03-13 DIAGNOSIS — Z1211 Encounter for screening for malignant neoplasm of colon: Secondary | ICD-10-CM

## 2024-03-13 DIAGNOSIS — Z125 Encounter for screening for malignant neoplasm of prostate: Secondary | ICD-10-CM | POA: Diagnosis not present

## 2024-03-13 DIAGNOSIS — Z23 Encounter for immunization: Secondary | ICD-10-CM | POA: Diagnosis not present

## 2024-03-13 DIAGNOSIS — E782 Mixed hyperlipidemia: Secondary | ICD-10-CM

## 2024-03-13 DIAGNOSIS — Z0000 Encounter for general adult medical examination without abnormal findings: Secondary | ICD-10-CM | POA: Diagnosis not present

## 2024-03-13 DIAGNOSIS — F3162 Bipolar disorder, current episode mixed, moderate: Secondary | ICD-10-CM

## 2024-03-13 LAB — COMPREHENSIVE METABOLIC PANEL WITH GFR
ALT: 22 U/L (ref 0–53)
AST: 23 U/L (ref 0–37)
Albumin: 4.8 g/dL (ref 3.5–5.2)
Alkaline Phosphatase: 66 U/L (ref 39–117)
BUN: 14 mg/dL (ref 6–23)
CO2: 30 meq/L (ref 19–32)
Calcium: 9.5 mg/dL (ref 8.4–10.5)
Chloride: 101 meq/L (ref 96–112)
Creatinine, Ser: 0.73 mg/dL (ref 0.40–1.50)
GFR: 102.25 mL/min (ref >60.00)
Glucose, Bld: 91 mg/dL (ref 70–99)
Potassium: 4.2 meq/L (ref 3.5–5.1)
Sodium: 141 meq/L (ref 135–145)
Total Bilirubin: 0.9 mg/dL (ref 0.2–1.2)
Total Protein: 7.2 g/dL (ref 6.0–8.3)

## 2024-03-13 LAB — LIPID PANEL
Cholesterol: 178 mg/dL (ref 0–200)
HDL: 77.3 mg/dL (ref >39.00)
LDL Cholesterol: 88 mg/dL (ref 0–99)
NonHDL: 100.54
Total CHOL/HDL Ratio: 2
Triglycerides: 63 mg/dL (ref 0.0–149.0)
VLDL: 12.6 mg/dL (ref 0.0–40.0)

## 2024-03-13 LAB — PSA: PSA: 1.33 ng/mL (ref 0.10–4.00)

## 2024-03-13 MED ORDER — LAMOTRIGINE 200 MG PO TABS: 200.0000 mg | ORAL_TABLET | Freq: Every day | ORAL | 1 refills | Status: DC

## 2024-03-13 MED ORDER — ALPRAZOLAM 1 MG PO TABS: ORAL_TABLET | ORAL | 3 refills | Status: DC

## 2024-03-13 NOTE — Progress Notes (Unsigned)
 Complete physical exam  Patient: Jason Mcknight   DOB: May 13, 1966   57 y.o. Male  MRN: [id number]  Subjective:     Chief Complaint  Patient presents with  . Annual Exam    Jason Mcknight is a 58 y.o. male who presents today for a complete physical exam. He reports consuming a general diet. Home exercise routine includes pushups and other home exercises, does set of 20's, walks a lot at work. He generally feels well. He reports sleeping well. He does not have additional problems to discuss today.    Most recent fall risk assessment:    03/02/2022   10:54 AM  Fall Risk   Falls in the past year? 0  Number falls in past yr: 0  Injury with Fall? 0  Risk for fall due to : No Fall Risks  Follow up Falls evaluation completed     Most recent depression screenings:    03/13/2024    2:12 PM 03/04/2023    3:44 PM  PHQ 2/9 Scores  PHQ - 2 Score 0 0  PHQ- 9 Score 0 0    Vision:Within last year and no vision problems  and Dental: No current dental problems and Receives regular dental care  Patient Active Problem List   Diagnosis Date Noted  . Erectile dysfunction 03/04/2023  . HLD (hyperlipidemia) 07/17/2022  . Anxiety 03/02/2022  . Bipolar 2 disorder (HCC) 03/02/2022  . Hypertension 11/28/2018  . Primary osteoarthritis of left knee 01/03/2018      Patient Care Team: Jason House, MD as PCP - General (Family Medicine)   Outpatient Medications Prior to Visit  Medication Sig  . ALPRAZolam  (XANAX ) 1 MG tablet TAKE 1 TABLET BY MOUTH EVERY DAY AS NEEDED  . atenolol  (TENORMIN ) 100 MG tablet TAKE 1 TABLET BY MOUTH EVERY DAY IN THE MORNING  . lamoTRIgine  (LAMICTAL ) 200 MG tablet TAKE 1 TABLET BY MOUTH EVERYDAY AT BEDTIME  . Multiple Vitamins-Minerals (MULTIVITAMIN WITH MINERALS) tablet Take 1 tablet by mouth daily.  . sildenafil  (VIAGRA ) 50 MG tablet TAKE 1 TABLET BY MOUTH DAILY AS NEEDED FOR ERECTILE DYSFUNCTION.  . [DISCONTINUED] Multiple Vitamin (MULTIVITAMIN  ADULT PO) Take by mouth.   No facility-administered medications prior to visit.    Review of Systems  HENT:  Negative for hearing loss.   Eyes:  Negative for blurred vision.  Respiratory:  Negative for shortness of breath.   Cardiovascular:  Negative for chest pain.  Gastrointestinal: Negative.   Genitourinary: Negative.   Musculoskeletal:  Negative for back pain.  Neurological:  Negative for headaches.  Psychiatric/Behavioral:  Negative for depression.   All other systems reviewed and are negative.      Objective:     BP (!) 110/58   Pulse (!) 57   Temp 97.6 F (36.4 C) (Oral)   Ht 5\' 8"  (1.727 m)   Wt 180 lb 6.4 oz (81.8 kg)   SpO2 98%   BMI 27.43 kg/m  {Vitals History (Optional):23777}  Physical Exam Vitals reviewed.  Constitutional:      Appearance: Normal appearance. He is well-groomed and normal weight.  HENT:     Right Ear: Tympanic membrane and ear canal normal.     Left Ear: Tympanic membrane and ear canal normal.     Mouth/Throat:     Mouth: Mucous membranes are moist.     Pharynx: No posterior oropharyngeal erythema.  Eyes:     Extraocular Movements: Extraocular movements intact.     Conjunctiva/sclera: Conjunctivae  normal.  Neck:     Thyroid : No thyromegaly.  Cardiovascular:     Rate and Rhythm: Normal rate and regular rhythm.     Heart sounds: S1 normal and S2 normal. No murmur heard. Pulmonary:     Effort: Pulmonary effort is normal.     Breath sounds: Normal breath sounds and air entry. No rales.  Abdominal:     General: Abdomen is flat. Bowel sounds are normal.  Musculoskeletal:     Right lower leg: No edema.     Left lower leg: No edema.  Lymphadenopathy:     Cervical: No cervical adenopathy.  Neurological:     General: No focal deficit present.     Mental Status: He is alert and oriented to person, place, and time.     Gait: Gait is intact.  Psychiatric:        Mood and Affect: Mood and affect normal.     No results found for any  visits on 03/13/24. {Show previous labs (optional):23779}    Assessment & Plan:    Routine Health Maintenance and Physical Exam  Immunization History  Administered Date(s) Administered  . Influenza-Unspecified 07/22/2018, 07/22/2020, 10/08/2023  . Janssen (J&J) SARS-COV-2 Vaccination 01/30/2020, 02/20/2020  . Td 02/19/2018  . Zoster Recombinant(Shingrix ) 11/08/2023    Health Maintenance  Topic Date Due  . COVID-19 Vaccine (3 - 2024-25 season) 06/23/2023  . Zoster Vaccines- Shingrix  (2 of 2) 01/03/2024  . Fecal DNA (Cologuard)  03/13/2024  . INFLUENZA VACCINE  05/22/2024  . DTaP/Tdap/Td (2 - Tdap) 02/20/2028  . Hepatitis C Screening  Completed  . HIV Screening  Completed  . HPV VACCINES  Aged Out  . Meningococcal B Vaccine  Aged Out  . Colonoscopy  Discontinued    Discussed health benefits of physical activity, and encouraged him to engage in regular exercise appropriate for his age and condition.  Hypertension, unspecified type -     Comprehensive metabolic panel with GFR; Future  Mixed hyperlipidemia -     Lipid panel; Future  Anxiety  Bipolar disorder, current episode mixed, moderate (HCC)  Immunization due  Colon cancer screening  Prostate cancer screening -     PSA; Future  Routine general medical examination at a health care facility    Return in 6 months (on 09/13/2024).     Jason House, MD

## 2024-03-13 NOTE — Patient Instructions (Signed)
 Health Maintenance, Male  Adopting a healthy lifestyle and getting preventive care are important in promoting health and wellness. Ask your health care provider about:  The right schedule for you to have regular tests and exams.  Things you can do on your own to prevent diseases and keep yourself healthy.  What should I know about diet, weight, and exercise?  Eat a healthy diet    Eat a diet that includes plenty of vegetables, fruits, low-fat dairy products, and lean protein.  Do not eat a lot of foods that are high in solid fats, added sugars, or sodium.  Maintain a healthy weight  Body mass index (BMI) is a measurement that can be used to identify possible weight problems. It estimates body fat based on height and weight. Your health care provider can help determine your BMI and help you achieve or maintain a healthy weight.  Get regular exercise  Get regular exercise. This is one of the most important things you can do for your health. Most adults should:  Exercise for at least 150 minutes each week. The exercise should increase your heart rate and make you sweat (moderate-intensity exercise).  Do strengthening exercises at least twice a week. This is in addition to the moderate-intensity exercise.  Spend less time sitting. Even light physical activity can be beneficial.  Watch cholesterol and blood lipids  Have your blood tested for lipids and cholesterol at 56 years of age, then have this test every 5 years.  You may need to have your cholesterol levels checked more often if:  Your lipid or cholesterol levels are high.  You are older than 56 years of age.  You are at high risk for heart disease.  What should I know about cancer screening?  Many types of cancers can be detected early and may often be prevented. Depending on your health history and family history, you may need to have cancer screening at various ages. This may include screening for:  Colorectal cancer.  Prostate cancer.  Skin cancer.  Lung  cancer.  What should I know about heart disease, diabetes, and high blood pressure?  Blood pressure and heart disease  High blood pressure causes heart disease and increases the risk of stroke. This is more likely to develop in people who have high blood pressure readings or are overweight.  Talk with your health care provider about your target blood pressure readings.  Have your blood pressure checked:  Every 3-5 years if you are 9-95 years of age.  Every year if you are 85 years old or older.  If you are between the ages of 29 and 29 and are a current or former smoker, ask your health care provider if you should have a one-time screening for abdominal aortic aneurysm (AAA).  Diabetes  Have regular diabetes screenings. This checks your fasting blood sugar level. Have the screening done:  Once every three years after age 23 if you are at a normal weight and have a low risk for diabetes.  More often and at a younger age if you are overweight or have a high risk for diabetes.  What should I know about preventing infection?  Hepatitis B  If you have a higher risk for hepatitis B, you should be screened for this virus. Talk with your health care provider to find out if you are at risk for hepatitis B infection.  Hepatitis C  Blood testing is recommended for:  Everyone born from [birth year] through 1963.  Anyone  with known risk factors for hepatitis C.  Sexually transmitted infections (STIs)  You should be screened each year for STIs, including gonorrhea and chlamydia, if:  You are sexually active and are younger than 56 years of age.  You are older than 56 years of age and your health care provider tells you that you are at risk for this type of infection.  Your sexual activity has changed since you were last screened, and you are at increased risk for chlamydia or gonorrhea. Ask your health care provider if you are at risk.  Ask your health care provider about whether you are at high risk for HIV. Your health care provider  may recommend a prescription medicine to help prevent HIV infection. If you choose to take medicine to prevent HIV, you should first get tested for HIV. You should then be tested every 3 months for as long as you are taking the medicine.  Follow these instructions at home:  Alcohol use  Do not drink alcohol if your health care provider tells you not to drink.  If you drink alcohol:  Limit how much you have to 0-2 drinks a day.  Know how much alcohol is in your drink. In the [place] one drink equals one 12 oz bottle of beer (355 mL), one 5 oz glass of wine (148 mL), or one 1 oz glass of hard liquor (44 mL).  Lifestyle  Do not use any products that contain nicotine or tobacco. These products include cigarettes, chewing tobacco, and vaping devices, such as e-cigarettes. If you need help quitting, ask your health care provider.  Do not use street drugs.  Do not share needles.  Ask your health care provider for help if you need support or information about quitting drugs.  General instructions  Schedule regular health, dental, and eye exams.  Stay current with your vaccines.  Tell your health care provider if:  You often feel depressed.  You have ever been abused or do not feel safe at home.  Summary  Adopting a healthy lifestyle and getting preventive care are important in promoting health and wellness.  Follow your health care provider's instructions about healthy diet, exercising, and getting tested or screened for diseases.  Follow your health care provider's instructions on monitoring your cholesterol and blood pressure.  This information is not intended to replace advice given to you by your health care provider. Make sure you discuss any questions you have with your health care provider.  Document Revised: 02/27/2021 Document Reviewed: 02/27/2021  Elsevier Patient Education  2024 [employer].

## 2024-03-14 ENCOUNTER — Ambulatory Visit: Payer: Self-pay | Admitting: Family Medicine

## 2024-03-25 DIAGNOSIS — Z1211 Encounter for screening for malignant neoplasm of colon: Secondary | ICD-10-CM | POA: Diagnosis not present

## 2024-03-27 ENCOUNTER — Ambulatory Visit (INDEPENDENT_AMBULATORY_CARE_PROVIDER_SITE_OTHER): Admitting: Family Medicine

## 2024-03-27 ENCOUNTER — Encounter: Payer: Self-pay | Admitting: Family Medicine

## 2024-03-27 VITALS — BP 122/60 | HR 55 | Temp 97.7°F | Ht 68.0 in | Wt 182.0 lb

## 2024-03-27 DIAGNOSIS — L820 Inflamed seborrheic keratosis: Secondary | ICD-10-CM

## 2024-03-27 NOTE — Progress Notes (Signed)
   Established Patient Office Visit  Subjective   Patient ID: Jason Mcknight, male    DOB: 1966-08-26  Age: 58 y.o. MRN: [id number]  Chief Complaint  Patient presents with   Procedure    Patient requests mole excisions from the neck and back    Pt is here for cryotherapy procedure for removal of multiple inflamed seborrheic keratoses. I discussed the risks/benefits of the procedure and after care with the patient today     Current Outpatient Medications  Medication Instructions   ALPRAZolam  (XANAX ) 1 MG tablet TAKE 1 TABLET BY MOUTH EVERY DAY AS NEEDED   atenolol  (TENORMIN ) 100 MG tablet TAKE 1 TABLET BY MOUTH EVERY DAY IN THE MORNING   lamoTRIgine  (LAMICTAL ) 200 mg, Oral, Daily at bedtime   Multiple Vitamins-Minerals (MULTIVITAMIN WITH MINERALS) tablet 1 tablet, Daily   sildenafil  (VIAGRA ) 50 MG tablet TAKE 1 TABLET BY MOUTH DAILY AS NEEDED FOR ERECTILE DYSFUNCTION.       Review of Systems  All other systems reviewed and are negative.     Objective:     BP 122/60   Pulse (!) 55   Temp 97.7 F (36.5 C) (Oral)   Ht 5\' 8"  (1.727 m)   Wt 182 lb (82.6 kg)   SpO2 99%   BMI 27.67 kg/m    Physical Exam Vitals reviewed.  Constitutional:      Appearance: Normal appearance. He is normal weight.  Pulmonary:     Effort: Pulmonary effort is normal.  Skin:    Comments: Skin survey shows numerous seborrheic keratosis spread over the arms, chest, back and trunk there were 7 lesions that were larger and more inflamed and these were the ones removed.   Neurological:     Mental Status: He is alert.    Cryotherapy procedure note:  Risks/benefits discussed with patient today including the risk of bleeding/infection/scarring. Patient gave verbal consent prior to the procedure.   The patient had multiple inflamed seborrheic keratosis, three on the right neck, one on the left shoulder, one on the right flank, and 2 on the upper back. 7 total lesions were targeted and I  performed a total of 4 freeze/ thaw cycles on each lesion. Patient tolerated procedure well. After care instructions given to patient.   No results found for any visits on 03/27/24.  Procedures    The 10-year ASCVD risk score (Arnett DK, et al., 2019) is: 3.6%    Assessment & Plan:   Keratosis, inflamed seborrheic   Pt tolerated procedure well, follow up in 6 months for his regular visit.   Return in about 6 months (around 09/26/2024) for med reflls.    Aida House, MD

## 2024-04-01 LAB — COLOGUARD: COLOGUARD: NEGATIVE

## 2024-05-16 ENCOUNTER — Other Ambulatory Visit: Payer: Self-pay | Admitting: Family Medicine

## 2024-05-16 DIAGNOSIS — I10 Essential (primary) hypertension: Secondary | ICD-10-CM

## 2024-05-22 ENCOUNTER — Other Ambulatory Visit: Payer: Self-pay | Admitting: Family Medicine

## 2024-05-22 DIAGNOSIS — N529 Male erectile dysfunction, unspecified: Secondary | ICD-10-CM

## 2024-05-27 ENCOUNTER — Other Ambulatory Visit: Payer: Self-pay | Admitting: Family Medicine

## 2024-05-27 DIAGNOSIS — I10 Essential (primary) hypertension: Secondary | ICD-10-CM

## 2024-06-01 ENCOUNTER — Telehealth: Payer: Self-pay | Admitting: *Deleted

## 2024-06-01 NOTE — Telephone Encounter (Signed)
 The code cannot be changed because that was the diagnosis- they were not cancerous lesions, just inflamed seborrheic keratosis so it cannot be changed.

## 2024-06-01 NOTE — Telephone Encounter (Signed)
 Copied from CRM [id number]. Topic: General - Billing Inquiry >> Jun 01, 2024  2:09 PM Aisha D wrote: Reason for CRM: Pt stated that he got a mole removal on 03/27/24 and the insurance is not covering a lot of the cost due to the codes on the procedure and is requiring him to pay $287. Pt would like for the codes to be updated so more of the bill can be on the insurance to pay. Pt would like a callback with an update.

## 2024-06-01 NOTE — Telephone Encounter (Signed)
 Patient informed of the message below.

## 2024-08-21 ENCOUNTER — Other Ambulatory Visit: Payer: Self-pay | Admitting: Family Medicine

## 2024-08-21 DIAGNOSIS — F419 Anxiety disorder, unspecified: Secondary | ICD-10-CM

## 2024-09-24 ENCOUNTER — Other Ambulatory Visit: Payer: Self-pay | Admitting: Family Medicine

## 2024-09-24 DIAGNOSIS — F419 Anxiety disorder, unspecified: Secondary | ICD-10-CM

## 2024-10-02 ENCOUNTER — Ambulatory Visit: Admitting: Family Medicine

## 2024-10-02 ENCOUNTER — Encounter: Payer: Self-pay | Admitting: Family Medicine

## 2024-10-02 VITALS — BP 120/68 | HR 70 | Temp 98.4°F | Ht 68.0 in | Wt 191.2 lb

## 2024-10-02 DIAGNOSIS — F4322 Adjustment disorder with anxiety: Secondary | ICD-10-CM

## 2024-10-02 DIAGNOSIS — F3162 Bipolar disorder, current episode mixed, moderate: Secondary | ICD-10-CM

## 2024-10-02 DIAGNOSIS — I10 Essential (primary) hypertension: Secondary | ICD-10-CM | POA: Diagnosis not present

## 2024-10-02 DIAGNOSIS — Z23 Encounter for immunization: Secondary | ICD-10-CM | POA: Diagnosis not present

## 2024-10-02 DIAGNOSIS — L820 Inflamed seborrheic keratosis: Secondary | ICD-10-CM

## 2024-10-02 MED ORDER — FLUOXETINE HCL 20 MG PO CAPS: 20.0000 mg | ORAL_CAPSULE | Freq: Every day | ORAL | 0 refills | Status: AC

## 2024-10-02 MED ORDER — LAMOTRIGINE 200 MG PO TABS: 200.0000 mg | ORAL_TABLET | Freq: Every day | ORAL | 1 refills | Status: AC

## 2024-10-02 MED ORDER — ATENOLOL 100 MG PO TABS: 100.0000 mg | ORAL_TABLET | Freq: Every day | ORAL | 1 refills | Status: AC

## 2024-10-02 NOTE — Progress Notes (Signed)
 Established Patient Office Visit  Subjective   Patient ID: Jason Mcknight, male    DOB: [birth year]  Age: 58 y.o. MRN: [id number]  Chief Complaint  Patient presents with   Follow-up    HPI Discussed the use of AI scribe software for clinical note transcription with the patient, who gave verbal consent to proceed.  History of Present Illness   Jason Mcknight is a 58 year old male with bipolar II disorder who presents for a six-month follow-up visit.  He reports worsened anxiety and mood since his wife left the home. He wakes thinking about her and self-medicates with a few beers after work. He takes Lamictal  200 mg at bedtime and Xanax  once daily.  He has not missed work and daily functioning is intact despite mood symptoms. He reports low energy and has never used antidepressants.  He would like additional moles on his head and neck removed, similar to a prior successful mole removal on his back.      Flowsheet Row Office Visit from 03/13/2024 in [facility]  PHQ-9 Total Score 0    Current Outpatient Medications  Medication Instructions   ALPRAZolam  (XANAX ) 1 mg, Oral, Daily PRN   atenolol  (TENORMIN ) 100 mg, Oral, Daily   FLUoxetine (PROZAC) 20 mg, Oral, Daily   lamoTRIgine  (LAMICTAL ) 200 mg, Oral, Daily at bedtime   Multiple Vitamins-Minerals (MULTIVITAMIN WITH MINERALS) tablet 1 tablet, Daily   sildenafil  (VIAGRA ) 50 MG tablet TAKE 1 TABLET BY MOUTH DAILY AS NEEDED FOR ERECTILE DYSFUNCTION.    Patient Active Problem List   Diagnosis Date Noted   Erectile dysfunction 03/04/2023   HLD (hyperlipidemia) 07/17/2022   Anxiety 03/02/2022   Bipolar 2 disorder (HCC) 03/02/2022   Hypertension 11/28/2018   Primary osteoarthritis of left knee 01/03/2018     Review of Systems  All other systems reviewed and are negative.     Objective:     BP 120/68 (BP Location: Left Arm, Patient Position: Sitting, Cuff Size: Large)   Pulse 70    Temp 98.4 F (36.9 C) (Oral)   Ht 5' 8 (1.727 m)   Wt 191 lb 3.2 oz (86.7 kg)   SpO2 98%   BMI 29.07 kg/m    Physical Exam Vitals reviewed.  Constitutional:      Appearance: Normal appearance. He is normal weight.  Cardiovascular:     Rate and Rhythm: Normal rate and regular rhythm.     Heart sounds: Normal heart sounds. No murmur heard. Pulmonary:     Effort: Pulmonary effort is normal.     Breath sounds: Normal breath sounds. No wheezing or rhonchi.  Neurological:     Mental Status: He is alert and oriented to person, place, and time. Mental status is at baseline.  Psychiatric:        Attention and Perception: Attention and perception normal.        Mood and Affect: Mood is depressed. Affect is flat.        Speech: Speech normal.        Behavior: Behavior normal.        Cognition and Memory: Cognition normal.   Cryotherapy Procedure Note  Pre-operative Diagnosis: inflamed seborheic keratosis  Locations:  head and trunk -- one on the top of the head and left neck near the clavicle  Indications: Therapeutic  Procedure Details  Patient informed of risks (permanent scarring, infection, light or dark discoloration, bleeding, infection, weakness, numbness and recurrence of the lesion) and  benefits of the procedure and verbal informed consent obtained.  The areas are treated with liquid nitrogen therapy, frozen until ice ball extended 3 mm beyond lesion, allowed to thaw, and treated again for a total of 3 freeze/ thaw cycles on each of the 2 lesions. The patient tolerated procedure well.  The patient was instructed on post-op care, warned that there may be blister formation, redness and pain. Recommend OTC analgesia as needed for pain.  Condition: Stable  Complications: none.    No results found for any visits on 10/02/24.    The 10-year ASCVD risk score (Arnett DK, et al., 2019) is: 3.9%    Assessment & Plan:  Adjustment disorder with anxious mood -      FLUoxetine HCl; Take 1 capsule (20 mg total) by mouth daily.  Dispense: 90 capsule; Refill: 0  Bipolar disorder, current episode mixed, moderate (HCC) -     lamoTRIgine ; Take 1 tablet (200 mg total) by mouth at bedtime.  Dispense: 90 tablet; Refill: 1  Hypertension, unspecified type Assessment & Plan: Current hypertension medications:       Sig   sildenafil  (VIAGRA ) 50 MG tablet (Taking) TAKE 1 TABLET BY MOUTH DAILY AS NEEDED FOR ERECTILE DYSFUNCTION.   atenolol  (TENORMIN ) 100 MG tablet Take 1 tablet (100 mg total) by mouth daily.     Chronic, stable, BP is controlled today, will continue with medication listed above.    Orders: -     Atenolol ; Take 1 tablet (100 mg total) by mouth daily.  Dispense: 90 tablet; Refill: 1  Immunization due -     Flu vaccine trivalent PF, 6mos and older(Flulaval,Afluria,Fluarix,Fluzone)  Assessment and Plan    Adjustment disorder with anxiety Experiencing anxiety related to recent family changes, including wife's return and subsequent departure. Currently on Lamictal  and Xanax . Discussed potential addition of an antidepressant or mood stabilizer. Prefers to try an antidepressant. Prozac chosen for its stimulating effects and low side effect profile. Discussed potential side effects, including weight gain of 8-10 pounds. - Prescribed Prozac for anxiety and mood stabilization. - Scheduled a follow-up video visit in 6-8 weeks to assess response to medication.  Bipolar disorder, current episode mixed Currently on Lamictal  200 mg at bedtime. Discussed potential addition of Abilify  for mood stabilization, but he prefers to try an antidepressant first. Prozac chosen as it can be safely used with Lamictal . - Continue Lamictal  200 mg at bedtime. - Prescribed Prozac for mood stabilization.  Benign skin lesions of head and neck Two large benign skin lesions on head and neck. Previous removal of moles was successful. Discussed freezing as a treatment option. He  agreed to proceed with freezing. - Performed cryotherapy on two large skin lesions on head and neck.  General Health Maintenance Discussed the importance of flu vaccination, especially before the winter winter. - Administered flu shot.         Return in about 8 weeks (around 11/27/2024) for video visit for anxiety follow up.    Heron CHRISTELLA Sharper, MD

## 2024-10-02 NOTE — Assessment & Plan Note (Signed)
 Current hypertension medications:       Sig   sildenafil  (VIAGRA ) 50 MG tablet (Taking) TAKE 1 TABLET BY MOUTH DAILY AS NEEDED FOR ERECTILE DYSFUNCTION.   atenolol  (TENORMIN ) 100 MG tablet Take 1 tablet (100 mg total) by mouth daily.     Chronic, stable, BP is controlled today, will continue with medication listed above.

## 2024-11-06 ENCOUNTER — Other Ambulatory Visit: Payer: Self-pay | Admitting: Family Medicine

## 2024-11-06 ENCOUNTER — Telehealth: Payer: Self-pay

## 2024-11-06 DIAGNOSIS — F419 Anxiety disorder, unspecified: Secondary | ICD-10-CM

## 2024-11-06 NOTE — Telephone Encounter (Signed)
 Copied from CRM [id number]. Topic: Clinical - Medication Refill >> Nov 06, 2024  2:14 PM Alexandria E wrote: Medication: ALPRAZolam  (XANAX ) 1 MG tablet  Has the patient contacted their pharmacy? Yes (Agent: If no, request that the patient contact the pharmacy for the refill. If patient does not wish to contact the pharmacy document the reason why and proceed with request.) (Agent: If yes, when and what did the pharmacy advise?)  This is the patient's preferred pharmacy:  [id number] - [place], [place] - [address] [place] [address] Phone: [phone] Fax: [phone]  Is this the correct pharmacy for this prescription? Yes If no, delete pharmacy and type the correct one.   Has the prescription been filled recently? No  Is the patient out of the medication? Yes  Has the patient been seen for an appointment in the last year OR does the patient have an upcoming appointment? Yes  Can we respond through MyChart? Yes  Agent: Please be advised that Rx refills may take up to 3 business days. We ask that you follow-up with your pharmacy.
